# Patient Record
Sex: Male | Born: 2000 | Hispanic: Refuse to answer | Marital: Single | Smoking: Never smoker
Health system: Southern US, Community
[De-identification: ages and names within clinical notes are randomized; demographics above are authoritative.]

## PROBLEM LIST (undated history)

## (undated) HISTORY — PX: NO PAST SURGERIES: SHX2092

---
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
## 2022-01-01 LAB — COMPLETE METABOLIC PANEL WITH GFR
AG Ratio: 2 (calc) (ref 1.0–2.5)
ALT: 44 U/L (ref 8–46)
AST: 29 U/L (ref 12–32)
Albumin: 4.8 g/dL (ref 3.6–5.1)
Alkaline phosphatase (APISO): 130 U/L (ref 46–169)
BUN: 11 mg/dL (ref 7–20)
CO2: 28 mmol/L (ref 20–32)
Calcium: 9.8 mg/dL (ref 8.9–10.4)
Chloride: 103 mmol/L (ref 98–110)
Creat: 0.95 mg/dL (ref 0.60–1.24)
Globulin: 2.4 g/dL (calc) (ref 2.1–3.5)
Glucose, Bld: 76 mg/dL (ref 65–99)
Potassium: 4.2 mmol/L (ref 3.8–5.1)
Sodium: 140 mmol/L (ref 135–146)
Total Bilirubin: 0.9 mg/dL (ref 0.2–1.1)
Total Protein: 7.2 g/dL (ref 6.3–8.2)
eGFR: 118 mL/min/{1.73_m2} (ref >=60)

## 2022-01-01 LAB — CBC
HCT: 46.9 % (ref 38.5–50.0)
Hemoglobin: 16 g/dL (ref 13.2–17.1)
MCH: 30 pg (ref 27.0–33.0)
MCHC: 34.1 g/dL (ref 32.0–36.0)
MCV: 88 fL (ref 80.0–100.0)
MPV: 11.1 fL (ref 7.5–12.5)
Platelets: 230 10*3/uL (ref 140–400)
RBC: 5.33 10*6/uL (ref 4.20–5.80)
RDW: 12.2 % (ref 11.0–15.0)
WBC: 7.4 10*3/uL (ref 3.8–10.8)

## 2022-01-01 LAB — LIPID PANEL W/REFLEX DIRECT LDL
Cholesterol: 162 mg/dL (ref <170)
HDL: 51 mg/dL (ref >45)
LDL Cholesterol (Calc): 97 mg/dL (calc) (ref <110)
Non-HDL Cholesterol (Calc): 111 mg/dL (calc) (ref <120)
Total CHOL/HDL Ratio: 3.2 (calc) (ref <5.0)
Triglycerides: 54 mg/dL (ref <90)

## 2022-01-02 ENCOUNTER — Encounter: Payer: Self-pay | Admitting: Family Medicine

## 2022-01-02 DIAGNOSIS — R0683 Snoring: Secondary | ICD-10-CM

## 2022-01-02 DIAGNOSIS — R5383 Other fatigue: Secondary | ICD-10-CM

## 2022-01-03 NOTE — Progress Notes (Signed)
Your lab work is within acceptable range and there are no concerning findings.   ?

## 2022-02-09 ENCOUNTER — Ambulatory Visit: Payer: BC Managed Care – PPO | Admitting: Professional

## 2022-02-10 ENCOUNTER — Encounter: Payer: Self-pay | Admitting: Professional

## 2022-02-10 ENCOUNTER — Ambulatory Visit (INDEPENDENT_AMBULATORY_CARE_PROVIDER_SITE_OTHER): Payer: BC Managed Care – PPO | Admitting: Professional

## 2022-02-10 DIAGNOSIS — F321 Major depressive disorder, single episode, moderate: Secondary | ICD-10-CM | POA: Diagnosis not present

## 2022-02-10 DIAGNOSIS — F419 Anxiety disorder, unspecified: Secondary | ICD-10-CM | POA: Diagnosis not present

## 2022-02-10 NOTE — Progress Notes (Signed)
° ° ° ° ° ° ° ° ° ° ° ° ° ° °  Ulyana Pitones, LCMHC °
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
## 2022-02-11 ENCOUNTER — Ambulatory Visit (HOSPITAL_BASED_OUTPATIENT_CLINIC_OR_DEPARTMENT_OTHER): Payer: BC Managed Care – PPO | Attending: Family Medicine | Admitting: Internal Medicine

## 2022-02-11 DIAGNOSIS — R0683 Snoring: Secondary | ICD-10-CM | POA: Diagnosis present

## 2022-02-11 DIAGNOSIS — R5383 Other fatigue: Secondary | ICD-10-CM | POA: Insufficient documentation

## 2022-02-11 DIAGNOSIS — G4733 Obstructive sleep apnea (adult) (pediatric): Secondary | ICD-10-CM | POA: Diagnosis not present

## 2022-02-19 DIAGNOSIS — R0683 Snoring: Secondary | ICD-10-CM | POA: Diagnosis not present

## 2022-02-19 NOTE — Procedures (Signed)
    Patient Name: Jerry Cisneros, Jerry Cisneros Date: 02/12/2022 Gender: Male D.O.B: Nov 30, 2000 Age (years): 21 Referring Provider: Beatrice Lecher Height (inches): 49 Interpreting Physician: Baird Lyons MD, ABSM Weight (lbs): 224 RPSGT: Jacolyn Reedy BMI: 34 MRN: [id number] Neck Size: 16.00  CLINICAL INFORMATION Sleep Study Type: HST Indication for sleep study: Somnolence Epworth Sleepiness Score: 3  SLEEP STUDY TECHNIQUE A multi-channel overnight portable sleep study was performed. The channels recorded were: nasal airflow, thoracic respiratory movement, and oxygen saturation with a pulse oximetry. Snoring was also monitored.  MEDICATIONS Patient self administered medications include: none reported.  SLEEP ARCHITECTURE Patient was studied for 439.3 minutes. The sleep efficiency was 100.0 % and the patient was supine for 0%. The arousal index was 0.0 per hour.  RESPIRATORY PARAMETERS The overall AHI was 5.2 per hour, with a central apnea index of 0 per hour. The oxygen nadir was 91% during sleep.  CARDIAC DATA Mean heart rate during sleep was 60.0 bpm.  IMPRESSIONS - Mild obstructive sleep apnea occurred during this study (AHI = 5.2/h). - The patient had minimal or no oxygen desaturation during the study (Min O2 = 91%) - Patient snored.  DIAGNOSIS - Obstructive Sleep Apnea (G47.33)  RECOMMENDATIONS - Scores in this range are unlikely to indicate medically significant sleep disordered breathing. If results are inconsistent with clinical impression, consider in-center sleep study. If a disorder of primary somnolence is suspected, such as narcolepsy, the Sleep Center can advise protocols for NPSG, followed by Multiple Sleep Latency Test, or consultation is available. - Sleep hygiene should be reviewed to assess factors that may improve sleep quality. - Weight management and regular exercise should be initiated or continued.  [Electronically signed] 02/19/2022 01:21  PM  Baird Lyons MD, [place], American Board of Sleep Medicine NPI: [id number]                         [place], [place] [facility]  ELECTRONICALLY SIGNED ON:  02/19/2022, 1:16 PM [place] PH: [phone]   FX: [phone] [place]

## 2022-02-21 ENCOUNTER — Telehealth: Payer: Self-pay | Admitting: Family Medicine

## 2022-02-21 NOTE — Telephone Encounter (Signed)
Call patient and let him know that I do have his sleep study results back.  I would like to schedule a telephone call so that we can go over the results together.

## 2022-02-21 NOTE — Telephone Encounter (Signed)
Forwarded patient a Therapist, music. Requesting a return call to schedule a visit to discuss sleep study.

## 2022-02-21 NOTE — Telephone Encounter (Signed)
Mother also gave [place] patient contact # [phone].

## 2022-02-21 NOTE — Telephone Encounter (Signed)
Patient informed and will call back to schedule the visit as either phone or Mychart visit.

## 2022-02-23 ENCOUNTER — Encounter: Payer: Self-pay | Admitting: Professional

## 2022-02-23 ENCOUNTER — Ambulatory Visit: Payer: BC Managed Care – PPO | Admitting: Professional

## 2022-02-23 DIAGNOSIS — F321 Major depressive disorder, single episode, moderate: Secondary | ICD-10-CM

## 2022-02-23 DIAGNOSIS — F419 Anxiety disorder, unspecified: Secondary | ICD-10-CM

## 2022-02-23 NOTE — Progress Notes (Addendum)
[place] Counselor/Therapist Progress Note  Patient ID: Jerry Cisneros, MRN: [id number],    Date: 02/23/2022  Time Spent: 57 minutes 503-6pm  Treatment Type: Individual Therapy  Risk Assessment: Danger to Self:  Yes.  without intent/plan, patient admits to fleeting thought at work because he didn't want to work. He reports he has no intention of hurting himself. Pt committed to call [phone] or go to nearest ED if he cannot be safe. Self-injurious Behavior: No Danger to Others: No  Subjective: This session was held via video teletherapy The patient consented to a the video visit and was located in his home during this session. He is aware it is the responsibility of the patient to secure confidentiality on his end of the session. The provider was in a private home office for the duration of this session.    The patient arrived on time for his webex session.   Issues addressed: 1-treatment planning -pt and Clinician developed his treatment plan -pt fully participated in and agrees with his treatment plan 2-problem solving -educated -real-life example related to buying steel toed boots for work  Treatment Plan Problems Addressed  Anxiety, Eating Disorders And Obesity, Family Conflict, Unipolar Depression Goals 1. Alleviate depressive symptoms and return to previous level of effective functioning. Objective Describe current and past experiences with depression including their impact on functioning and attempts to resolve it. Target Date: 2023-02-23 Frequency: Weekly  Progress: 0 Modality: individual  Related Interventions Encourage the client to share his/her thoughts and feelings of depression; express empathy and build rapport while identifying primary cognitive, behavioral, interpersonal, or other contributors to depression. Assess current and past mood episodes including their features, frequency, severity, and duration (e.g., clinical interview supplemented by  the Inventory to Diagnose Depression). Objective Identify and replace thoughts and beliefs that support depression. Target Date: 2023-02-23 Frequency: Weekly  Progress: 0 Modality: individual  Related Interventions Conduct Cognitive-Behavioral Therapy (see Cognitive Behavior Therapy by Olevia Bowens; Overcoming Depression by Lynita Lombard al.), beginning with helping the client learn the connection among cognition, depressive feelings, and actions. Assign the client to self-monitor thoughts, feelings, and actions in daily journal (e.g., "Negative Thoughts Trigger Negative Feelings" in the Adult Psychotherapy Homework Planner by Bryn Gulling; "Daily Record of Dysfunctional Thoughts" in Cognitive Therapy of Depression by Lupita Shutter and Warren Lacy); process the journal material to challenge depressive thinking patterns and replace them with reality-based thoughts. Assign "behavioral experiments" in which depressive automatic thoughts are treated as hypotheses/prediction, reality-based alternative hypotheses/prediction are generated, and both are tested against the client's past, present, and/or future experiences. Facilitate and reinforce the client's shift from biased depressive self-talk and beliefs to reality-based cognitive messages that enhance self-confidence and increase adaptive actions (see "Positive Self-Talk" in the Adult Psychotherapy Homework Planner by Bryn Gulling). Objective Learn and implement behavioral strategies to overcome depression. Target Date: 2023-02-23 Frequency: Weekly  Progress: 0 Modality: individual  Related Interventions Engage the client in "behavioral activation," increasing his/her activity level and contact with sources of reward, while identifying processes that inhibit activation (see Behavioral Activation for Depression by Beverly Gust, Dimidjian, and Herman-Dunn; or assign "Identify and Schedule Pleasant Activities" in the Adult Psychotherapy Homework Planner by [facility]); use behavioral  techniques such as instruction, rehearsal, role-playing, role reversal, as needed, to facilitate activity in the client's daily life; reinforce success. Assist the client in developing skills that increase the likelihood of deriving pleasure from behavioral activation (e.g., assertiveness skills, developing an exercise plan, less internal/more external focus, increased social involvement); reinforce success. Objective Verbalize an  understanding and resolution of current interpersonal problems. Target Date: 2023-02-23 Frequency: Weekly  Progress: 0 Modality: individual  Related Interventions For role transitions (e.g., beginning or ending a relationship or career, moving, promotion, retirement, graduation), help the client mourn the loss of the old role while recognizing positive and negative aspects of the new role, and taking steps to gain mastery over the new role. Objective Learn and implement problem-solving and decision-making skills. Target Date: 2023-02-23 Frequency: Weekly  Progress: 0 Modality: individual  Objective Learn and implement conflict resolution skills to resolve interpersonal problems. Target Date: 2023-02-23 Frequency: Weekly  Progress: 0 Modality: individual  Related Interventions Teach conflict resolution skills (e.g., empathy, active listening, "I messages," respectful communication, assertiveness without aggression, compromise); use psychoeducation, modeling, role-playing, and rehearsal to work through several current conflicts; assign homework exercises; review and repeat so as to integrate their use into the client's life. Objective Provide behavioral, emotional, and attitudinal information toward an assessment of specifiers relevant to a DSM diagnosis, the efficacy of treatment, and the nature of the therapy relationship. Target Date: 2023-02-23 Frequency: Weekly  Progress: 0 Modality: individual  2. Appropriately grieve the loss in order to normalize mood and to  return to previously adaptive level of functioning. 3. Decrease the level of present conflict with parents while beginning to let go of or resolving past conflicts with them. Objective Describe the conflicts and the causes of conflicts between self and parents. Target Date: 2023-02-23 Frequency: Weekly  Progress: 0 Modality: individual  Related Interventions Give verbal permission for the client to have and express own feelings, thoughts, and perspectives in order to foster a sense of autonomy from family. Explore the nature of the client's family conflicts and their perceived causes. Objective Identify own as well as others' role in the family conflicts. Target Date: 2023-02-23 Frequency: Weekly  Progress: 0 Modality: individual  Related Interventions Confront the client when he/she is not taking responsibility for his/her role in the family conflict and reinforce the client for owning responsibility for his/her contribution to the conflict. Objective Increase the level of independent functioning. Target Date: 2023-02-23 Frequency: Weekly  Progress: 0 Modality: individual  Related Interventions Confront the client's emotional dependence and avoidance of economic responsibility that promotes continuing pattern of living with parents; develop a plan for the client's healthy and responsible emancipation from parents that is, if possible, complete with their blessing (e.g., finding and keeping a job, saving money, socializing with friends, finding own housing, etc.). Objective Report an increase in resolving conflicts with parents by talking calmly and assertively rather than aggressively and defensively. Target Date: 2023-02-23 Frequency: Weekly  Progress: 0 Modality: individual  Related Interventions Use role-playing, role reversal, modeling, and behavioral rehearsal to help the client develop assertive ways to resolve conflict with parents (recommend Your Perfect Right: Assertiveness and  Equality in Your Life and Relationships by Neldon Newport). 4. Develop coping strategies (e.g., feeling identification, problem-solving, assertiveness) to address emotional issues that could lead to relapse of the eating disorder. 5. Develop healthy cognitive patterns and beliefs about self that lead to positive identity and prevent a relapse of the eating disorder. 6. Develop healthy interpersonal relationships that lead to alleviation and help prevent the relapse of the eating disorder. 7. Develop healthy interpersonal relationships that lead to the alleviation and help prevent the relapse of depression. 8. Develop healthy thinking patterns and beliefs about self, others, and the world that lead to the alleviation and help prevent the relapse of depression. 9. Enhance ability to effectively  cope with the full variety of life's worries and anxieties. 10. Learn and implement coping skills that result in a reduction of anxiety and worry, and improved daily functioning. 11. Reach a level of reduced tension, increased satisfaction, and improved communication with family and/or other authority figures. 12. Recognize, accept, and cope with feelings of depression. 13. Restore normal eating patterns, healthy weight maintenance, and a realistic appraisal of body size. Objective Honestly describe the pattern of eating including types, amounts, and frequency of food consumed or hoarded. Target Date: 2023-02-23 Frequency: Weekly  Progress: 0 Modality: individual  Related Interventions Assess the historical course of the disorder including the amount, type, and pattern of the client's food intake (e.g., too little food, too much food, binge eating, or hoarding food); perceived personal and interpersonal triggers and personal goals. Objective Establish regular eating patterns by eating at regular intervals and consuming optimal daily calories. Target Date: 2023-02-23 Frequency: Weekly  Progress: 0  Modality: individual  Related Interventions Establish healthy weight goals for the client per the Body Mass Index (BMI), the [place] Height and Weight Tables, or some other recognized standard. Objective Learn and implement skills for managing urges to engage in unhealthy eating or weight loss practices. Target Date: 2023-02-23 Frequency: Weekly  Progress: 0 Modality: individual  Related Interventions Teach the client tailored skills to manage high-risk situations including distraction, positive self-talk, problem-solving, conflict resolution (e.g., empathy, active listening, "I messages," respectful communication, assertiveness without aggression, compromise), or other social/ communication skills; use modeling, role-playing, and behavior rehearsal to work through several current situations. Objective State a basis for positive identity that is not based on weight and appearance but on character, traits, relationships, and intrinsic value. Target Date: 2023-02-23 Frequency: Weekly  Progress: 0 Modality: individual  Related Interventions Assist the client in identifying a basis for self-worth apart from body image by reviewing his/her talents, successes, positive traits, importance to others, and intrinsic spiritual value. Objective Verbalize an understanding of relapse prevention and the distinction between a lapse and a relapse. Target Date: 2023-02-23 Frequency: Weekly  Progress: 0 Modality: individual  Related Interventions Discuss with the client the distinction between a lapse and relapse, associating a lapse with an initial and reversible return of distress, urges, or to avoid, and relapse with the decision to return to the cycle of maladaptive thoughts and actions (e.g., feeling anxious, binging, then purging). Identify with the client future situations or circumstances in which lapses could occur. 14. Stabilize anxiety level while increasing ability to function on a daily  basis. Objective Learn and implement problem-solving strategies for realistically addressing worries. Target Date: 2023-02-23 Frequency: Weekly  Progress: 0 Modality: individual  Related Interventions Teach the client problem-solving strategies involving specifically defining a problem, generating options for addressing it, evaluating the pros and cons of each option, selecting and implementing an optional action, and reevaluating and refining the action (or assign "Applying Problem-Solving to Interpersonal Conflict" in the Adult Psychotherapy Homework Planner by Bryn Gulling). Objective Identify, challenge, and replace biased, fearful self-talk with positive, realistic, and empowering self-talk. Target Date: 2023-02-23 Frequency: Weekly  Progress: 0 Modality: individual  Related Interventions Explore the client's schema and self-talk that mediate his/her fear response; assist him/her in challenging the biases; replace the distorted messages with reality-based alternatives and positive, realistic self-talk that will increase his/her self-confidence in coping with irrational fears (see Cognitive Therapy of Anxiety Disorders by Alison Stalling). Assign the client a homework exercise in which he/she identifies fearful self-talk, identifies biases in the self-talk, generates alternatives,  and tests through behavioral experiments (or assign "Negative Thoughts Trigger Negative Feelings" in the Adult Psychotherapy Homework Planner by [facility]); review and reinforce success, providing corrective feedback toward improvement. Objective Learn to accept limitations in life and commit to tolerating, rather than avoiding, unpleasant emotions while accomplishing meaningful goals. Target Date: 2023-02-23 Frequency: Weekly  Progress: 0 Modality: individual  Related Interventions Use techniques from Acceptance and Commitment Therapy to help client accept uncomfortable realities such as lack of complete control,  imperfections, and uncertainty and tolerate unpleasant emotions and thoughts in order to accomplish value-consistent goals. Objective Learn and implement personal and interpersonal skills to reduce anxiety and improve interpersonal relationships. Target Date: 2023-02-23 Frequency: Weekly  Progress: 0 Modality: individual  Related Interventions Use instruction, modeling, and role-playing to build the client's general social, communication, and/or conflict resolution skills. Objective Maintain involvement in work, family, and social activities. Target Date: 2023-02-23 Frequency: Weekly  Progress: 0 Modality: individual  Related Interventions Support the client in following through with work, family, and social activities rather than escaping or avoiding them to focus on anxiety. 15. Terminate overeating and implement lifestyle changes that lead to weight loss and improved health. 16. Terminate the pattern of binge eating and purging behavior with a return to eating normal amounts of nutritious foods.  Diagnosis:MDD (major depressive disorder), single episode, moderate ([place])  Anxiety  Plan:  -begin noticing negative thinking and try to consider more positive alternatives. -meet again on Wednesday, March 09, 2022 at 5pm.

## 2022-02-23 NOTE — Progress Notes (Unsigned)
° ° ° ° ° ° ° ° ° ° ° ° ° ° °  Erick Murin, LCMHC °

## 2022-03-09 ENCOUNTER — Ambulatory Visit: Payer: BC Managed Care – PPO | Admitting: Professional

## 2022-03-17 ENCOUNTER — Encounter: Payer: Self-pay | Admitting: Professional

## 2022-03-17 ENCOUNTER — Ambulatory Visit: Payer: BC Managed Care – PPO | Admitting: Professional

## 2022-03-17 DIAGNOSIS — F321 Major depressive disorder, single episode, moderate: Secondary | ICD-10-CM

## 2022-03-17 DIAGNOSIS — F419 Anxiety disorder, unspecified: Secondary | ICD-10-CM | POA: Diagnosis not present

## 2022-03-17 NOTE — Progress Notes (Signed)
[facility] Counselor/Therapist Progress Note  Patient ID: Jerry Cisneros, MRN: [id number],    Date: 02/23/2022  Time Spent: 62 minutes [id number]  Treatment Type: Individual Therapy  Risk Assessment: Danger to Self:  Yes.  without intent/plan, patient admits to fleeting thought at work because he didn't want to work. He reports he has no intention of hurting himself. Pt committed to call [phone] or go to nearest ED if he cannot be safe. Self-injurious Behavior: No Danger to Others: No  Subjective: This session was held via video teletherapy The patient consented to a the video visit and was located in his home during this session. He is aware it is the responsibility of the patient to secure confidentiality on his end of the session. The provider was in a private home office for the duration of this session.    The patient arrived on time for his webex session.   Issues addressed: 1-personal a-pt misstepped with friends online and has been down for the past several days -he sent an unsavory joke online to his friends -people found it offensive and one girl cut him off -he has had this issue with getting into interpersonal struggles since high school -he has intentionally ended relationships because he knew they were already fading away b-mistakes occur and learn from vs. villanize yourself -how to forgive yourself for missteps -how to take mistakes as learning opportunities c-fleeting thoughts of suicide as result -pt denies plan but admits he was struggling due to his behavior online -he is not currently suicidal and has no intention to hurt himself or anyone else -Clinician reiterated plan to call [phone] or go to nearest ED if he cannot remain safe 2-professional -pt does his work and gets there on-time  Treatment Plan Problems Addressed  Anxiety, Eating Disorders And Obesity, Family Conflict, Unipolar Depression Goals 1. Alleviate depressive symptoms and return  to previous level of effective functioning. Objective Describe current and past experiences with depression including their impact on functioning and attempts to resolve it. Target Date: 2023-02-23 Frequency: Weekly  Progress: 0 Modality: individual  Related Interventions Encourage the client to share his/her thoughts and feelings of depression; express empathy and build rapport while identifying primary cognitive, behavioral, interpersonal, or other contributors to depression. Assess current and past mood episodes including their features, frequency, severity, and duration (e.g., clinical interview supplemented by the Inventory to Diagnose Depression). Objective Identify and replace thoughts and beliefs that support depression. Target Date: 2023-02-23 Frequency: Weekly  Progress: 0 Modality: individual  Related Interventions Conduct Cognitive-Behavioral Therapy (see Cognitive Behavior Therapy by Olevia Bowens; Overcoming Depression by Lynita Lombard al.), beginning with helping the client learn the connection among cognition, depressive feelings, and actions. Assign the client to self-monitor thoughts, feelings, and actions in daily journal (e.g., "Negative Thoughts Trigger Negative Feelings" in the Adult Psychotherapy Homework Planner by Bryn Gulling; "Daily Record of Dysfunctional Thoughts" in Cognitive Therapy of Depression by Lupita Shutter and Warren Lacy); process the journal material to challenge depressive thinking patterns and replace them with reality-based thoughts. Assign "behavioral experiments" in which depressive automatic thoughts are treated as hypotheses/prediction, reality-based alternative hypotheses/prediction are generated, and both are tested against the client's past, present, and/or future experiences. Facilitate and reinforce the client's shift from biased depressive self-talk and beliefs to reality-based cognitive messages that enhance self-confidence and increase adaptive actions (see  "Positive Self-Talk" in the Adult Psychotherapy Homework Planner by Bryn Gulling). Objective Learn and implement behavioral strategies to overcome depression. Target Date: 2023-02-23 Frequency: Weekly  Progress: 0 Modality:  individual  Related Interventions Engage the client in "behavioral activation," increasing his/her activity level and contact with sources of reward, while identifying processes that inhibit activation (see Behavioral Activation for Depression by Beverly Gust, Dimidjian, and Herman-Dunn; or assign "Identify and Schedule Pleasant Activities" in the Adult Psychotherapy Homework Planner by [facility]); use behavioral techniques such as instruction, rehearsal, role-playing, role reversal, as needed, to facilitate activity in the client's daily life; reinforce success. Assist the client in developing skills that increase the likelihood of deriving pleasure from behavioral activation (e.g., assertiveness skills, developing an exercise plan, less internal/more external focus, increased social involvement); reinforce success. Objective Verbalize an understanding and resolution of current interpersonal problems. Target Date: 2023-02-23 Frequency: Weekly  Progress: 0 Modality: individual  Related Interventions For role transitions (e.g., beginning or ending a relationship or career, moving, promotion, retirement, graduation), help the client mourn the loss of the old role while recognizing positive and negative aspects of the new role, and taking steps to gain mastery over the new role. Objective Learn and implement problem-solving and decision-making skills. Target Date: 2023-02-23 Frequency: Weekly  Progress: 0 Modality: individual  Objective Learn and implement conflict resolution skills to resolve interpersonal problems. Target Date: 2023-02-23 Frequency: Weekly  Progress: 0 Modality: individual  Related Interventions Teach conflict resolution skills (e.g., empathy, active listening, "I  messages," respectful communication, assertiveness without aggression, compromise); use psychoeducation, modeling, role-playing, and rehearsal to work through several current conflicts; assign homework exercises; review and repeat so as to integrate their use into the client's life. Objective Provide behavioral, emotional, and attitudinal information toward an assessment of specifiers relevant to a DSM diagnosis, the efficacy of treatment, and the nature of the therapy relationship. Target Date: 2023-02-23 Frequency: Weekly  Progress: 0 Modality: individual  2. Appropriately grieve the loss in order to normalize mood and to return to previously adaptive level of functioning. 3. Decrease the level of present conflict with parents while beginning to let go of or resolving past conflicts with them. Objective Describe the conflicts and the causes of conflicts between self and parents. Target Date: 2023-02-23 Frequency: Weekly  Progress: 0 Modality: individual  Related Interventions Give verbal permission for the client to have and express own feelings, thoughts, and perspectives in order to foster a sense of autonomy from family. Explore the nature of the client's family conflicts and their perceived causes. Objective Identify own as well as others' role in the family conflicts. Target Date: 2023-02-23 Frequency: Weekly  Progress: 0 Modality: individual  Related Interventions Confront the client when he/she is not taking responsibility for his/her role in the family conflict and reinforce the client for owning responsibility for his/her contribution to the conflict. Objective Increase the level of independent functioning. Target Date: 2023-02-23 Frequency: Weekly  Progress: 0 Modality: individual  Related Interventions Confront the client's emotional dependence and avoidance of economic responsibility that promotes continuing pattern of living with parents; develop a plan for the client's  healthy and responsible emancipation from parents that is, if possible, complete with their blessing (e.g., finding and keeping a job, saving money, socializing with friends, finding own housing, etc.). Objective Report an increase in resolving conflicts with parents by talking calmly and assertively rather than aggressively and defensively. Target Date: 2023-02-23 Frequency: Weekly  Progress: 0 Modality: individual  Related Interventions Use role-playing, role reversal, modeling, and behavioral rehearsal to help the client develop assertive ways to resolve conflict with parents (recommend Your Perfect Right: Assertiveness and Equality in Your Life and Relationships by Neldon Newport). 4.  Develop coping strategies (e.g., feeling identification, problem-solving, assertiveness) to address emotional issues that could lead to relapse of the eating disorder. 5. Develop healthy cognitive patterns and beliefs about self that lead to positive identity and prevent a relapse of the eating disorder. 6. Develop healthy interpersonal relationships that lead to alleviation and help prevent the relapse of the eating disorder. 7. Develop healthy interpersonal relationships that lead to the alleviation and help prevent the relapse of depression. 8. Develop healthy thinking patterns and beliefs about self, others, and the world that lead to the alleviation and help prevent the relapse of depression. 9. Enhance ability to effectively cope with the full variety of life's worries and anxieties. 10. Learn and implement coping skills that result in a reduction of anxiety and worry, and improved daily functioning. 11. Reach a level of reduced tension, increased satisfaction, and improved communication with family and/or other authority figures. 12. Recognize, accept, and cope with feelings of depression. 13. Restore normal eating patterns, healthy weight maintenance, and a realistic appraisal of body  size. Objective Honestly describe the pattern of eating including types, amounts, and frequency of food consumed or hoarded. Target Date: 2023-02-23 Frequency: Weekly  Progress: 0 Modality: individual  Related Interventions Assess the historical course of the disorder including the amount, type, and pattern of the client's food intake (e.g., too little food, too much food, binge eating, or hoarding food); perceived personal and interpersonal triggers and personal goals. Objective Establish regular eating patterns by eating at regular intervals and consuming optimal daily calories. Target Date: 2023-02-23 Frequency: Weekly  Progress: 0 Modality: individual  Related Interventions Establish healthy weight goals for the client per the Body Mass Index (BMI), the [place] Height and Weight Tables, or some other recognized standard. Objective Learn and implement skills for managing urges to engage in unhealthy eating or weight loss practices. Target Date: 2023-02-23 Frequency: Weekly  Progress: 0 Modality: individual  Related Interventions Teach the client tailored skills to manage high-risk situations including distraction, positive self-talk, problem-solving, conflict resolution (e.g., empathy, active listening, "I messages," respectful communication, assertiveness without aggression, compromise), or other social/ communication skills; use modeling, role-playing, and behavior rehearsal to work through several current situations. Objective State a basis for positive identity that is not based on weight and appearance but on character, traits, relationships, and intrinsic value. Target Date: 2023-02-23 Frequency: Weekly  Progress: 0 Modality: individual  Related Interventions Assist the client in identifying a basis for self-worth apart from body image by reviewing his/her talents, successes, positive traits, importance to others, and intrinsic spiritual value. Objective Verbalize an  understanding of relapse prevention and the distinction between a lapse and a relapse. Target Date: 2023-02-23 Frequency: Weekly  Progress: 0 Modality: individual  Related Interventions Discuss with the client the distinction between a lapse and relapse, associating a lapse with an initial and reversible return of distress, urges, or to avoid, and relapse with the decision to return to the cycle of maladaptive thoughts and actions (e.g., feeling anxious, binging, then purging). Identify with the client future situations or circumstances in which lapses could occur. 14. Stabilize anxiety level while increasing ability to function on a daily basis. Objective Learn and implement problem-solving strategies for realistically addressing worries. Target Date: 2023-02-23 Frequency: Weekly  Progress: 0 Modality: individual  Related Interventions Teach the client problem-solving strategies involving specifically defining a problem, generating options for addressing it, evaluating the pros and cons of each option, selecting and implementing an optional action, and reevaluating and refining the action (or  assign "Applying Problem-Solving to Interpersonal Conflict" in the Adult Psychotherapy Homework Planner by Bryn Gulling). Objective Identify, challenge, and replace biased, fearful self-talk with positive, realistic, and empowering self-talk. Target Date: 2023-02-23 Frequency: Weekly  Progress: 0 Modality: individual  Related Interventions Explore the client's schema and self-talk that mediate his/her fear response; assist him/her in challenging the biases; replace the distorted messages with reality-based alternatives and positive, realistic self-talk that will increase his/her self-confidence in coping with irrational fears (see Cognitive Therapy of Anxiety Disorders by Alison Stalling). Assign the client a homework exercise in which he/she identifies fearful self-talk, identifies biases in the self-talk,  generates alternatives, and tests through behavioral experiments (or assign "Negative Thoughts Trigger Negative Feelings" in the Adult Psychotherapy Homework Planner by [facility]); review and reinforce success, providing corrective feedback toward improvement. Objective Learn to accept limitations in life and commit to tolerating, rather than avoiding, unpleasant emotions while accomplishing meaningful goals. Target Date: 2023-02-23 Frequency: Weekly  Progress: 0 Modality: individual  Related Interventions Use techniques from Acceptance and Commitment Therapy to help client accept uncomfortable realities such as lack of complete control, imperfections, and uncertainty and tolerate unpleasant emotions and thoughts in order to accomplish value-consistent goals. Objective Learn and implement personal and interpersonal skills to reduce anxiety and improve interpersonal relationships. Target Date: 2023-02-23 Frequency: Weekly  Progress: 0 Modality: individual  Related Interventions Use instruction, modeling, and role-playing to build the client's general social, communication, and/or conflict resolution skills. Objective Maintain involvement in work, family, and social activities. Target Date: 2023-02-23 Frequency: Weekly  Progress: 0 Modality: individual  Related Interventions Support the client in following through with work, family, and social activities rather than escaping or avoiding them to focus on anxiety. 15. Terminate overeating and implement lifestyle changes that lead to weight loss and improved health. 16. Terminate the pattern of binge eating and purging behavior with a return to eating normal amounts of nutritious foods.  Diagnosis:MDD (major depressive disorder), single episode, moderate ([place])  Anxiety  Plan:  -begin tracking negative thinking, reading grounding activities and begin using to assist with anxiety -meet again on Thursday, March 31, 2022 at 5pm in-person.

## 2022-03-21 ENCOUNTER — Ambulatory Visit: Payer: BC Managed Care – PPO | Admitting: Professional

## 2022-03-31 ENCOUNTER — Encounter: Payer: Self-pay | Admitting: Professional

## 2022-03-31 ENCOUNTER — Ambulatory Visit: Payer: BC Managed Care – PPO | Admitting: Professional

## 2022-03-31 DIAGNOSIS — F321 Major depressive disorder, single episode, moderate: Secondary | ICD-10-CM

## 2022-03-31 DIAGNOSIS — F419 Anxiety disorder, unspecified: Secondary | ICD-10-CM

## 2022-03-31 NOTE — Progress Notes (Signed)
[facility] Counselor/Therapist Progress Note  Patient ID: Jerry Cisneros, MRN: [id number],    Date: 02/23/2022  Time Spent: 68 minutes [id number]  Treatment Type: Individual Therapy  Risk Assessment: Danger to Self:  Yes.  without intent/plan, patient admits to fleeting thought at work because he didn't want to work. He reports he has no intention of hurting himself. Pt committed to call [phone] or go to nearest ED if he cannot be safe. Self-injurious Behavior: No Danger to Others: No  Subjective: This session was held via video teletherapy The patient consented to a the video visit and was located in his home during this session. He is aware it is the responsibility of the patient to secure confidentiality on his end of the session. The provider was in a private home office for the duration of this session.    The patient arrived on time for his webex session.   Issues addressed: 1-homework -begin tracking negative thinking, reading grounding activities and begin using to assist with anxiety -he did not really try -discussed how 336 between session and if doesn't do any work between session nothing will change  2-professional a-moving to a different location with new hours and perhaps new boss b-stressed about coworkers and boss a bit -current boss pretty chill -company not doing well and people being pulled ot a different location -worrying because he is not meeting expectations of having trailers ready   -it is taken to different location and hey will get called out for a trailer that is not fully loaded 3-personal a-wants to get back in school -started a course online and it is currently review b-he has talked to another person  who explained his need to work specific projects c-patient has moved past social mistakes discussed at previous session 4-anxiety -started attending Anxiety Support group weekly and finds it helpful  Treatment Plan Problems Addressed   Anxiety, Eating Disorders And Obesity, Family Conflict, Unipolar Depression Goals 1. Alleviate depressive symptoms and return to previous level of effective functioning. Objective Describe current and past experiences with depression including their impact on functioning and attempts to resolve it. Target Date: 2023-02-23 Frequency: Weekly  Progress: 0 Modality: individual  Related Interventions Encourage the client to share his/her thoughts and feelings of depression; express empathy and build rapport while identifying primary cognitive, behavioral, interpersonal, or other contributors to depression. Assess current and past mood episodes including their features, frequency, severity, and duration (e.g., clinical interview supplemented by the Inventory to Diagnose Depression). Objective Identify and replace thoughts and beliefs that support depression. Target Date: 2023-02-23 Frequency: Weekly  Progress: 0 Modality: individual  Related Interventions Conduct Cognitive-Behavioral Therapy (see Cognitive Behavior Therapy by Olevia Bowens; Overcoming Depression by Lynita Lombard al.), beginning with helping the client learn the connection among cognition, depressive feelings, and actions. Assign the client to self-monitor thoughts, feelings, and actions in daily journal (e.g., "Negative Thoughts Trigger Negative Feelings" in the Adult Psychotherapy Homework Planner by Bryn Gulling; "Daily Record of Dysfunctional Thoughts" in Cognitive Therapy of Depression by Lupita Shutter and Warren Lacy); process the journal material to challenge depressive thinking patterns and replace them with reality-based thoughts. Assign "behavioral experiments" in which depressive automatic thoughts are treated as hypotheses/prediction, reality-based alternative hypotheses/prediction are generated, and both are tested against the client's past, present, and/or future experiences. Facilitate and reinforce the client's shift from biased depressive  self-talk and beliefs to reality-based cognitive messages that enhance self-confidence and increase adaptive actions (see "Positive Self-Talk" in the Adult Psychotherapy Homework Planner by Bryn Gulling).  Objective Learn and implement behavioral strategies to overcome depression. Target Date: 2023-02-23 Frequency: Weekly  Progress: 0 Modality: individual  Related Interventions Engage the client in "behavioral activation," increasing his/her activity level and contact with sources of reward, while identifying processes that inhibit activation (see Behavioral Activation for Depression by Beverly Gust, Dimidjian, and Herman-Dunn; or assign "Identify and Schedule Pleasant Activities" in the Adult Psychotherapy Homework Planner by [facility]); use behavioral techniques such as instruction, rehearsal, role-playing, role reversal, as needed, to facilitate activity in the client's daily life; reinforce success. Assist the client in developing skills that increase the likelihood of deriving pleasure from behavioral activation (e.g., assertiveness skills, developing an exercise plan, less internal/more external focus, increased social involvement); reinforce success. Objective Verbalize an understanding and resolution of current interpersonal problems. Target Date: 2023-02-23 Frequency: Weekly  Progress: 0 Modality: individual  Related Interventions For role transitions (e.g., beginning or ending a relationship or career, moving, promotion, retirement, graduation), help the client mourn the loss of the old role while recognizing positive and negative aspects of the new role, and taking steps to gain mastery over the new role. Objective Learn and implement problem-solving and decision-making skills. Target Date: 2023-02-23 Frequency: Weekly  Progress: 0 Modality: individual  Objective Learn and implement conflict resolution skills to resolve interpersonal problems. Target Date: 2023-02-23 Frequency: Weekly  Progress: 0  Modality: individual  Related Interventions Teach conflict resolution skills (e.g., empathy, active listening, "I messages," respectful communication, assertiveness without aggression, compromise); use psychoeducation, modeling, role-playing, and rehearsal to work through several current conflicts; assign homework exercises; review and repeat so as to integrate their use into the client's life. Objective Provide behavioral, emotional, and attitudinal information toward an assessment of specifiers relevant to a DSM diagnosis, the efficacy of treatment, and the nature of the therapy relationship. Target Date: 2023-02-23 Frequency: Weekly  Progress: 0 Modality: individual  2. Appropriately grieve the loss in order to normalize mood and to return to previously adaptive level of functioning. 3. Decrease the level of present conflict with parents while beginning to let go of or resolving past conflicts with them. Objective Describe the conflicts and the causes of conflicts between self and parents. Target Date: 2023-02-23 Frequency: Weekly  Progress: 0 Modality: individual  Related Interventions Give verbal permission for the client to have and express own feelings, thoughts, and perspectives in order to foster a sense of autonomy from family. Explore the nature of the client's family conflicts and their perceived causes. Objective Identify own as well as others' role in the family conflicts. Target Date: 2023-02-23 Frequency: Weekly  Progress: 0 Modality: individual  Related Interventions Confront the client when he/she is not taking responsibility for his/her role in the family conflict and reinforce the client for owning responsibility for his/her contribution to the conflict. Objective Increase the level of independent functioning. Target Date: 2023-02-23 Frequency: Weekly  Progress: 0 Modality: individual  Related Interventions Confront the client's emotional dependence and avoidance of  economic responsibility that promotes continuing pattern of living with parents; develop a plan for the client's healthy and responsible emancipation from parents that is, if possible, complete with their blessing (e.g., finding and keeping a job, saving money, socializing with friends, finding own housing, etc.). Objective Report an increase in resolving conflicts with parents by talking calmly and assertively rather than aggressively and defensively. Target Date: 2023-02-23 Frequency: Weekly  Progress: 0 Modality: individual  Related Interventions Use role-playing, role reversal, modeling, and behavioral rehearsal to help the client develop assertive ways to resolve conflict with  parents (recommend Your Perfect Right: Assertiveness and Equality in Your Life and Relationships by Neldon Newport). 4. Develop coping strategies (e.g., feeling identification, problem-solving, assertiveness) to address emotional issues that could lead to relapse of the eating disorder. 5. Develop healthy cognitive patterns and beliefs about self that lead to positive identity and prevent a relapse of the eating disorder. 6. Develop healthy interpersonal relationships that lead to alleviation and help prevent the relapse of the eating disorder. 7. Develop healthy interpersonal relationships that lead to the alleviation and help prevent the relapse of depression. 8. Develop healthy thinking patterns and beliefs about self, others, and the world that lead to the alleviation and help prevent the relapse of depression. 9. Enhance ability to effectively cope with the full variety of life's worries and anxieties. 10. Learn and implement coping skills that result in a reduction of anxiety and worry, and improved daily functioning. 11. Reach a level of reduced tension, increased satisfaction, and improved communication with family and/or other authority figures. 12. Recognize, accept, and cope with feelings of depression. 13.  Restore normal eating patterns, healthy weight maintenance, and a realistic appraisal of body size. Objective Honestly describe the pattern of eating including types, amounts, and frequency of food consumed or hoarded. Target Date: 2023-02-23 Frequency: Weekly  Progress: 0 Modality: individual  Related Interventions Assess the historical course of the disorder including the amount, type, and pattern of the client's food intake (e.g., too little food, too much food, binge eating, or hoarding food); perceived personal and interpersonal triggers and personal goals. Objective Establish regular eating patterns by eating at regular intervals and consuming optimal daily calories. Target Date: 2023-02-23 Frequency: Weekly  Progress: 0 Modality: individual  Related Interventions Establish healthy weight goals for the client per the Body Mass Index (BMI), the [place] Height and Weight Tables, or some other recognized standard. Objective Learn and implement skills for managing urges to engage in unhealthy eating or weight loss practices. Target Date: 2023-02-23 Frequency: Weekly  Progress: 0 Modality: individual  Related Interventions Teach the client tailored skills to manage high-risk situations including distraction, positive self-talk, problem-solving, conflict resolution (e.g., empathy, active listening, "I messages," respectful communication, assertiveness without aggression, compromise), or other social/ communication skills; use modeling, role-playing, and behavior rehearsal to work through several current situations. Objective State a basis for positive identity that is not based on weight and appearance but on character, traits, relationships, and intrinsic value. Target Date: 2023-02-23 Frequency: Weekly  Progress: 0 Modality: individual  Related Interventions Assist the client in identifying a basis for self-worth apart from body image by reviewing his/her talents, successes, positive  traits, importance to others, and intrinsic spiritual value. Objective Verbalize an understanding of relapse prevention and the distinction between a lapse and a relapse. Target Date: 2023-02-23 Frequency: Weekly  Progress: 0 Modality: individual  Related Interventions Discuss with the client the distinction between a lapse and relapse, associating a lapse with an initial and reversible return of distress, urges, or to avoid, and relapse with the decision to return to the cycle of maladaptive thoughts and actions (e.g., feeling anxious, binging, then purging). Identify with the client future situations or circumstances in which lapses could occur. 14. Stabilize anxiety level while increasing ability to function on a daily basis. Objective Learn and implement problem-solving strategies for realistically addressing worries. Target Date: 2023-02-23 Frequency: Weekly  Progress: 0 Modality: individual  Related Interventions Teach the client problem-solving strategies involving specifically defining a problem, generating options for addressing it, evaluating the pros  and cons of each option, selecting and implementing an optional action, and reevaluating and refining the action (or assign "Applying Problem-Solving to Interpersonal Conflict" in the Adult Psychotherapy Homework Planner by Bryn Gulling). Objective Identify, challenge, and replace biased, fearful self-talk with positive, realistic, and empowering self-talk. Target Date: 2023-02-23 Frequency: Weekly  Progress: 0 Modality: individual  Related Interventions Explore the client's schema and self-talk that mediate his/her fear response; assist him/her in challenging the biases; replace the distorted messages with reality-based alternatives and positive, realistic self-talk that will increase his/her self-confidence in coping with irrational fears (see Cognitive Therapy of Anxiety Disorders by Alison Stalling). Assign the client a homework exercise in  which he/she identifies fearful self-talk, identifies biases in the self-talk, generates alternatives, and tests through behavioral experiments (or assign "Negative Thoughts Trigger Negative Feelings" in the Adult Psychotherapy Homework Planner by [facility]); review and reinforce success, providing corrective feedback toward improvement. Objective Learn to accept limitations in life and commit to tolerating, rather than avoiding, unpleasant emotions while accomplishing meaningful goals. Target Date: 2023-02-23 Frequency: Weekly  Progress: 0 Modality: individual  Related Interventions Use techniques from Acceptance and Commitment Therapy to help client accept uncomfortable realities such as lack of complete control, imperfections, and uncertainty and tolerate unpleasant emotions and thoughts in order to accomplish value-consistent goals. Objective Learn and implement personal and interpersonal skills to reduce anxiety and improve interpersonal relationships. Target Date: 2023-02-23 Frequency: Weekly  Progress: 0 Modality: individual  Related Interventions Use instruction, modeling, and role-playing to build the client's general social, communication, and/or conflict resolution skills. Objective Maintain involvement in work, family, and social activities. Target Date: 2023-02-23 Frequency: Weekly  Progress: 0 Modality: individual  Related Interventions Support the client in following through with work, family, and social activities rather than escaping or avoiding them to focus on anxiety. 15. Terminate overeating and implement lifestyle changes that lead to weight loss and improved health. 16. Terminate the pattern of binge eating and purging behavior with a return to eating normal amounts of nutritious foods.  Diagnosis:MDD (major depressive disorder), single episode, moderate ([place])  Anxiety  Plan:  -pt has Clinician phone number if needing any urgent support between sessions. -pt reminded  of how to access [phone] or go to nearest ED if feeling suicidal or homocidal and unable to remain safe. -pt reports he will not do anything to hurt himself or anyone else and will call for assistance as needed. -meet again on Thursday, May 05, 2022 at 5pm.

## 2022-04-07 ENCOUNTER — Ambulatory Visit: Payer: BC Managed Care – PPO | Admitting: Professional

## 2022-04-07 ENCOUNTER — Encounter: Payer: Self-pay | Admitting: Professional

## 2022-04-07 DIAGNOSIS — F419 Anxiety disorder, unspecified: Secondary | ICD-10-CM | POA: Diagnosis not present

## 2022-04-07 DIAGNOSIS — F321 Major depressive disorder, single episode, moderate: Secondary | ICD-10-CM

## 2022-04-07 NOTE — Progress Notes (Signed)
[facility] Counselor/Therapist Progress Note  Patient ID: Jerry Cisneros, MRN: [id number],    Date: 02/23/2022  Time Spent: 57 minutes [id number]  Treatment Type: Individual Therapy  Risk Assessment: Danger to Self:  Yes.  without intent/plan, patient admits to fleeting thought at work because he didn't want to work. He reports he has no intention of hurting himself. Pt committed to call [phone] or go to nearest ED if he cannot be safe. Self-injurious Behavior: No Danger to Others: No  Subjective: This session was held via video teletherapy The patient consented to a the video visit and was located in his home during this session. He is aware it is the responsibility of the patient to secure confidentiality on his end of the session. The provider was in a private home office for the duration of this session.    The patient arrived early for his webex session.   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 c-pt admits that he does not feel confident -able to identify that his father is not confident in himself   -his father was put down by his parents and pt's mother also puts him down d-feels his age is a barrier at work   -has grown a beard to appear older -discussed age vs competence and confidence -pt had shared his father's need to seek assistance from pt on work tasks   -if age measured competence his father would not require his help -is this his perspective or something said by someone to/overheard by him   -pt admits it is what he is thinking 3-personal a-wants to get back in school -has been putting off his school work though this is what he really wants -he values education and wants to attend school b-how to set  himself up for success -create schedule -give himself time to relax once work is completed -consider making a step by step plan to help him achieve his goals 4-pt talked with Clinician about discomfort discussing religious issues -Clinician thanked patient for discussing, accepted his feedback, and apologized if he felt uncomfortable   -pt is the person that presented his concerns related to his mother pushing her religion on him   -pt has expectations imposed on him at home and he is uncertain if he believes anything -Clinician reminded pt that moving forward his spirituality will only be broached by him -he was appropriate and confronted his concerns well  Treatment Plan Problems Addressed  Anxiety, Eating Disorders And Obesity, Family Conflict, Unipolar Depression Goals 1. Alleviate depressive symptoms and return to previous level of effective functioning. Objective Describe current and past experiences with depression including their impact on functioning and attempts to resolve it. Target Date: 2023-02-23 Frequency: Weekly  Progress: 0 Modality: individual  Related Interventions Encourage the client to share his/her thoughts and feelings of depression; express empathy and build rapport while identifying primary cognitive, behavioral, interpersonal, or other contributors to depression. Assess current and past mood episodes including their features, frequency, severity, and duration (e.g., clinical interview supplemented by the Inventory to Diagnose Depression). Objective Identify and replace thoughts and beliefs that support depression. Target Date: 2023-02-23 Frequency: Weekly  Progress: 0 Modality: individual  Related Interventions Conduct Cognitive-Behavioral Therapy (see Cognitive Behavior Therapy by Olevia Bowens;  Overcoming Depression by Lynita Lombard al.), beginning with helping the client learn the connection among cognition, depressive feelings, and actions. Assign the client to  self-monitor thoughts, feelings, and actions in daily journal (e.g., "Negative Thoughts Trigger Negative Feelings" in the Adult Psychotherapy Homework Planner by Bryn Gulling; "Daily Record of Dysfunctional Thoughts" in Cognitive Therapy of Depression by Lupita Shutter and Warren Lacy); process the journal material to challenge depressive thinking patterns and replace them with reality-based thoughts. Assign "behavioral experiments" in which depressive automatic thoughts are treated as hypotheses/prediction, reality-based alternative hypotheses/prediction are generated, and both are tested against the client's past, present, and/or future experiences. Facilitate and reinforce the client's shift from biased depressive self-talk and beliefs to reality-based cognitive messages that enhance self-confidence and increase adaptive actions (see "Positive Self-Talk" in the Adult Psychotherapy Homework Planner by Bryn Gulling). Objective Learn and implement behavioral strategies to overcome depression. Target Date: 2023-02-23 Frequency: Weekly  Progress: 0 Modality: individual  Related Interventions Engage the client in "behavioral activation," increasing his/her activity level and contact with sources of reward, while identifying processes that inhibit activation (see Behavioral Activation for Depression by Beverly Gust, Dimidjian, and Herman-Dunn; or assign "Identify and Schedule Pleasant Activities" in the Adult Psychotherapy Homework Planner by [facility]); use behavioral techniques such as instruction, rehearsal, role-playing, role reversal, as needed, to facilitate activity in the client's daily life; reinforce success. Assist the client in developing skills that increase the likelihood of deriving pleasure from behavioral activation (e.g., assertiveness skills, developing an exercise plan, less internal/more external focus, increased social involvement); reinforce success. Objective Verbalize an understanding and resolution of  current interpersonal problems. Target Date: 2023-02-23 Frequency: Weekly  Progress: 0 Modality: individual  Related Interventions For role transitions (e.g., beginning or ending a relationship or career, moving, promotion, retirement, graduation), help the client mourn the loss of the old role while recognizing positive and negative aspects of the new role, and taking steps to gain mastery over the new role. Objective Learn and implement problem-solving and decision-making skills. Target Date: 2023-02-23 Frequency: Weekly  Progress: 0 Modality: individual  Objective Learn and implement conflict resolution skills to resolve interpersonal problems. Target Date: 2023-02-23 Frequency: Weekly  Progress: 0 Modality: individual  Related Interventions Teach conflict resolution skills (e.g., empathy, active listening, "I messages," respectful communication, assertiveness without aggression, compromise); use psychoeducation, modeling, role-playing, and rehearsal to work through several current conflicts; assign homework exercises; review and repeat so as to integrate their use into the client's life. Objective Provide behavioral, emotional, and attitudinal information toward an assessment of specifiers relevant to a DSM diagnosis, the efficacy of treatment, and the nature of the therapy relationship. Target Date: 2023-02-23 Frequency: Weekly  Progress: 0 Modality: individual  2. Appropriately grieve the loss in order to normalize mood and to return to previously adaptive level of functioning. 3. Decrease the level of present conflict with parents while beginning to let go of or resolving past conflicts with them. Objective Describe the conflicts and the causes of conflicts between self and parents. Target Date: 2023-02-23 Frequency: Weekly  Progress: 0 Modality: individual  Related Interventions Give verbal permission for the client to have and express own feelings, thoughts, and perspectives in  order to foster a sense of autonomy from family. Explore the nature of the client's family conflicts and their perceived causes. Objective Identify own as well as others' role in the family conflicts. Target Date: 2023-02-23 Frequency: Weekly  Progress: 0 Modality: individual  Related Interventions Confront the client when he/she is not taking responsibility for his/her role  in the family conflict and reinforce the client for owning responsibility for his/her contribution to the conflict. Objective Increase the level of independent functioning. Target Date: 2023-02-23 Frequency: Weekly  Progress: 0 Modality: individual  Related Interventions Confront the client's emotional dependence and avoidance of economic responsibility that promotes continuing pattern of living with parents; develop a plan for the client's healthy and responsible emancipation from parents that is, if possible, complete with their blessing (e.g., finding and keeping a job, saving money, socializing with friends, finding own housing, etc.). Objective Report an increase in resolving conflicts with parents by talking calmly and assertively rather than aggressively and defensively. Target Date: 2023-02-23 Frequency: Weekly  Progress: 0 Modality: individual  Related Interventions Use role-playing, role reversal, modeling, and behavioral rehearsal to help the client develop assertive ways to resolve conflict with parents (recommend Your Perfect Right: Assertiveness and Equality in Your Life and Relationships by Neldon Newport). 4. Develop coping strategies (e.g., feeling identification, problem-solving, assertiveness) to address emotional issues that could lead to relapse of the eating disorder. 5. Develop healthy cognitive patterns and beliefs about self that lead to positive identity and prevent a relapse of the eating disorder. 6. Develop healthy interpersonal relationships that lead to alleviation and help prevent the  relapse of the eating disorder. 7. Develop healthy interpersonal relationships that lead to the alleviation and help prevent the relapse of depression. 8. Develop healthy thinking patterns and beliefs about self, others, and the world that lead to the alleviation and help prevent the relapse of depression. 9. Enhance ability to effectively cope with the full variety of life's worries and anxieties. 10. Learn and implement coping skills that result in a reduction of anxiety and worry, and improved daily functioning. 11. Reach a level of reduced tension, increased satisfaction, and improved communication with family and/or other authority figures. 12. Recognize, accept, and cope with feelings of depression. 13. Restore normal eating patterns, healthy weight maintenance, and a realistic appraisal of body size. Objective Honestly describe the pattern of eating including types, amounts, and frequency of food consumed or hoarded. Target Date: 2023-02-23 Frequency: Weekly  Progress: 0 Modality: individual  Related Interventions Assess the historical course of the disorder including the amount, type, and pattern of the client's food intake (e.g., too little food, too much food, binge eating, or hoarding food); perceived personal and interpersonal triggers and personal goals. Objective Establish regular eating patterns by eating at regular intervals and consuming optimal daily calories. Target Date: 2023-02-23 Frequency: Weekly  Progress: 0 Modality: individual  Related Interventions Establish healthy weight goals for the client per the Body Mass Index (BMI), the [place] Height and Weight Tables, or some other recognized standard. Objective Learn and implement skills for managing urges to engage in unhealthy eating or weight loss practices. Target Date: 2023-02-23 Frequency: Weekly  Progress: 0 Modality: individual  Related Interventions Teach the client tailored skills to manage high-risk  situations including distraction, positive self-talk, problem-solving, conflict resolution (e.g., empathy, active listening, "I messages," respectful communication, assertiveness without aggression, compromise), or other social/ communication skills; use modeling, role-playing, and behavior rehearsal to work through several current situations. Objective State a basis for positive identity that is not based on weight and appearance but on character, traits, relationships, and intrinsic value. Target Date: 2023-02-23 Frequency: Weekly  Progress: 0 Modality: individual  Related Interventions Assist the client in identifying a basis for self-worth apart from body image by reviewing his/her talents, successes, positive traits, importance to others, and intrinsic spiritual value. Objective Verbalize  an understanding of relapse prevention and the distinction between a lapse and a relapse. Target Date: 2023-02-23 Frequency: Weekly  Progress: 0 Modality: individual  Related Interventions Discuss with the client the distinction between a lapse and relapse, associating a lapse with an initial and reversible return of distress, urges, or to avoid, and relapse with the decision to return to the cycle of maladaptive thoughts and actions (e.g., feeling anxious, binging, then purging). Identify with the client future situations or circumstances in which lapses could occur. 14. Stabilize anxiety level while increasing ability to function on a daily basis. Objective Learn and implement problem-solving strategies for realistically addressing worries. Target Date: 2023-02-23 Frequency: Weekly  Progress: 0 Modality: individual  Related Interventions Teach the client problem-solving strategies involving specifically defining a problem, generating options for addressing it, evaluating the pros and cons of each option, selecting and implementing an optional action, and reevaluating and refining the action (or assign  "Applying Problem-Solving to Interpersonal Conflict" in the Adult Psychotherapy Homework Planner by Bryn Gulling). Objective Identify, challenge, and replace biased, fearful self-talk with positive, realistic, and empowering self-talk. Target Date: 2023-02-23 Frequency: Weekly  Progress: 0 Modality: individual  Related Interventions Explore the client's schema and self-talk that mediate his/her fear response; assist him/her in challenging the biases; replace the distorted messages with reality-based alternatives and positive, realistic self-talk that will increase his/her self-confidence in coping with irrational fears (see Cognitive Therapy of Anxiety Disorders by Alison Stalling). Assign the client a homework exercise in which he/she identifies fearful self-talk, identifies biases in the self-talk, generates alternatives, and tests through behavioral experiments (or assign "Negative Thoughts Trigger Negative Feelings" in the Adult Psychotherapy Homework Planner by [facility]); review and reinforce success, providing corrective feedback toward improvement. Objective Learn to accept limitations in life and commit to tolerating, rather than avoiding, unpleasant emotions while accomplishing meaningful goals. Target Date: 2023-02-23 Frequency: Weekly  Progress: 0 Modality: individual  Related Interventions Use techniques from Acceptance and Commitment Therapy to help client accept uncomfortable realities such as lack of complete control, imperfections, and uncertainty and tolerate unpleasant emotions and thoughts in order to accomplish value-consistent goals. Objective Learn and implement personal and interpersonal skills to reduce anxiety and improve interpersonal relationships. Target Date: 2023-02-23 Frequency: Weekly  Progress: 0 Modality: individual  Related Interventions Use instruction, modeling, and role-playing to build the client's general social, communication, and/or conflict resolution  skills. Objective Maintain involvement in work, family, and social activities. Target Date: 2023-02-23 Frequency: Weekly  Progress: 0 Modality: individual  Related Interventions Support the client in following through with work, family, and social activities rather than escaping or avoiding them to focus on anxiety. 15. Terminate overeating and implement lifestyle changes that lead to weight loss and improved health. 16. Terminate the pattern of binge eating and purging behavior with a return to eating normal amounts of nutritious foods.  Diagnosis:MDD (major depressive disorder), single episode, moderate ([place])  Anxiety  Plan:  -pt has Clinician phone number if needing any urgent support between sessions. -pt reminded of how to access [phone] or go to nearest ED if feeling suicidal or homocidal and unable to remain safe. -pt reports he will not do anything to hurt himself or anyone else and will call for assistance as needed. -meet again on Thursday, May 05, 2022 at 5pm.

## 2022-04-18 ENCOUNTER — Ambulatory Visit: Payer: BC Managed Care – PPO | Admitting: Professional

## 2022-04-28 ENCOUNTER — Ambulatory Visit: Payer: BC Managed Care – PPO | Admitting: Professional

## 2022-05-05 ENCOUNTER — Ambulatory Visit (INDEPENDENT_AMBULATORY_CARE_PROVIDER_SITE_OTHER): Payer: BC Managed Care – PPO | Admitting: Professional

## 2022-05-05 ENCOUNTER — Encounter: Payer: Self-pay | Admitting: Professional

## 2022-05-05 DIAGNOSIS — F321 Major depressive disorder, single episode, moderate: Secondary | ICD-10-CM

## 2022-05-05 DIAGNOSIS — F419 Anxiety disorder, unspecified: Secondary | ICD-10-CM | POA: Diagnosis not present

## 2022-05-05 NOTE — Progress Notes (Signed)
[place] [facility] Counselor/Therapist Progress Note  Patient ID: Jerry Cisneros, MRN: [id number],    Date: 05/05/2022  Time Spent: 56 minutes [id number]  Treatment Type: Individual Therapy  Risk Assessment: Danger to Self:  No Self-injurious Behavior: No Danger to Others: No  Subjective: The patient arrived early for his in-person session.   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 Alleviate depressive symptoms and return to previous level of effective functioning. Objective Describe current and past experiences with depression including their impact on functioning and attempts to resolve it. Target Date: 2023-02-23 Frequency: Weekly  Progress: 0 Modality: individual  Related Interventions Encourage the client to share his/her thoughts and feelings of depression; express empathy and build rapport while identifying primary cognitive, behavioral, interpersonal, or other contributors to depression. Assess current and past mood episodes including their features, frequency, severity, and duration (e.g., clinical interview supplemented by the Inventory to Diagnose Depression). Objective Identify and replace thoughts and beliefs that support depression. Target Date: 2023-02-23 Frequency: Weekly  Progress: 0 Modality: individual  Related Interventions Conduct  Cognitive-Behavioral Therapy (see Cognitive Behavior Therapy by Olevia Bowens; Overcoming Depression by Lynita Lombard al.), beginning with helping the client learn the connection among cognition, depressive feelings, and actions. Assign the client to self-monitor thoughts, feelings, and actions in daily journal (e.g., "Negative Thoughts Trigger Negative Feelings" in the Adult Psychotherapy Homework Planner by Bryn Gulling; "Daily Record of Dysfunctional Thoughts" in Cognitive Therapy of Depression by Lupita Shutter and Warren Lacy); process the journal material to challenge depressive thinking patterns and replace them with reality-based thoughts. Assign "behavioral experiments" in which depressive automatic thoughts are treated as hypotheses/prediction, reality-based alternative hypotheses/prediction are generated, and both are tested against the client's past, present, and/or future experiences. Facilitate and reinforce the client's shift from biased depressive self-talk and beliefs to reality-based cognitive messages that enhance self-confidence and increase adaptive actions (see "Positive Self-Talk" in the Adult Psychotherapy Homework Planner by Bryn Gulling). Objective Learn and implement behavioral strategies to overcome depression. Target Date: 2023-02-23 Frequency: Weekly  Progress: 0 Modality: individual  Related Interventions Engage the client in "behavioral activation," increasing his/her activity level and contact with sources of reward, while identifying processes that inhibit activation (see Behavioral Activation for Depression by Beverly Gust, Dimidjian, and Herman-Dunn; or assign "Identify and Schedule Pleasant Activities" in the Adult Psychotherapy Homework Planner by [facility]); use behavioral techniques such as instruction, rehearsal, role-playing, role reversal, as needed, to facilitate activity in the client's daily life; reinforce success. Assist the client in developing skills that increase the likelihood of  deriving pleasure from behavioral activation (e.g., assertiveness skills, developing an exercise plan, less internal/more external focus, increased social involvement); reinforce success. Objective Verbalize an understanding and resolution of current interpersonal problems. Target Date: 2023-02-23 Frequency: Weekly  Progress: 0 Modality: individual  Related Interventions For role transitions (e.g., beginning or ending a relationship or career, moving, promotion, retirement, graduation), help the client mourn the loss of the  old role while recognizing positive and negative aspects of the new role, and taking steps to gain mastery over the new role. Objective Learn and implement problem-solving and decision-making skills. Target Date: 2023-02-23 Frequency: Weekly  Progress: 0 Modality: individual  Objective Learn and implement conflict resolution skills to resolve interpersonal problems. Target Date: 2023-02-23 Frequency: Weekly  Progress: 0 Modality: individual  Related Interventions Teach conflict resolution skills (e.g., empathy, active listening, "I messages," respectful communication, assertiveness without aggression, compromise); use psychoeducation, modeling, role-playing, and rehearsal to work through several current conflicts; assign homework exercises; review and repeat so as to integrate their use into the client's life. Objective Provide behavioral, emotional, and attitudinal information toward an assessment of specifiers relevant to a DSM diagnosis, the efficacy of treatment, and the nature of the therapy relationship. Target Date: 2023-02-23 Frequency: Weekly  Progress: 0 Modality: individual  2. Appropriately grieve the loss in order to normalize mood and to return to previously adaptive level of functioning. 3. Decrease the level of present conflict with parents while beginning to let go of or resolving past conflicts with them. Objective Describe the conflicts and the causes of  conflicts between self and parents. Target Date: 2023-02-23 Frequency: Weekly  Progress: 0 Modality: individual  Related Interventions Give verbal permission for the client to have and express own feelings, thoughts, and perspectives in order to foster a sense of autonomy from family. Explore the nature of the client's family conflicts and their perceived causes. Objective Identify own as well as others' role in the family conflicts. Target Date: 2023-02-23 Frequency: Weekly  Progress: 0 Modality: individual  Related Interventions Confront the client when he/she is not taking responsibility for his/her role in the family conflict and reinforce the client for owning responsibility for his/her contribution to the conflict. Objective Increase the level of independent functioning. Target Date: 2023-02-23 Frequency: Weekly  Progress: 0 Modality: individual  Related Interventions Confront the client's emotional dependence and avoidance of economic responsibility that promotes continuing pattern of living with parents; develop a plan for the client's healthy and responsible emancipation from parents that is, if possible, complete with their blessing (e.g., finding and keeping a job, saving money, socializing with friends, finding own housing, etc.). Objective Report an increase in resolving conflicts with parents by talking calmly and assertively rather than aggressively and defensively. Target Date: 2023-02-23 Frequency: Weekly  Progress: 0 Modality: individual  Related Interventions Use role-playing, role reversal, modeling, and behavioral rehearsal to help the client develop assertive ways to resolve conflict with parents (recommend Your Perfect Right: Assertiveness and Equality in Your Life and Relationships by Neldon Newport). 4. Develop coping strategies (e.g., feeling identification, problem-solving, assertiveness) to address emotional issues that could lead to relapse of the eating  disorder. 5. Develop healthy cognitive patterns and beliefs about self that lead to positive identity and prevent a relapse of the eating disorder. 6. Develop healthy interpersonal relationships that lead to alleviation and help prevent the relapse of the eating disorder. 7. Develop healthy interpersonal relationships that lead to the alleviation and help prevent the relapse of depression. 8. Develop healthy thinking patterns and beliefs about self, others, and the world that lead to the alleviation and help prevent the relapse of depression. 9. Enhance ability to effectively cope with the full variety of life's worries and anxieties. 10. Learn and implement coping skills that result in a reduction of anxiety and worry, and improved daily functioning. 11. Reach a level of reduced tension, increased satisfaction, and improved communication with family  and/or other authority figures. 12. Recognize, accept, and cope with feelings of depression. 13. Restore normal eating patterns, healthy weight maintenance, and a realistic appraisal of body size. Objective Honestly describe the pattern of eating including types, amounts, and frequency of food consumed or hoarded. Target Date: 2023-02-23 Frequency: Weekly  Progress: 0 Modality: individual  Related Interventions Assess the historical course of the disorder including the amount, type, and pattern of the client's food intake (e.g., too little food, too much food, binge eating, or hoarding food); perceived personal and interpersonal triggers and personal goals. Objective Establish regular eating patterns by eating at regular intervals and consuming optimal daily calories. Target Date: 2023-02-23 Frequency: Weekly  Progress: 0 Modality: individual  Related Interventions Establish healthy weight goals for the client per the Body Mass Index (BMI), the [place] Height and Weight Tables, or some other recognized standard. Objective Learn and implement  skills for managing urges to engage in unhealthy eating or weight loss practices. Target Date: 2023-02-23 Frequency: Weekly  Progress: 0 Modality: individual  Related Interventions Teach the client tailored skills to manage high-risk situations including distraction, positive self-talk, problem-solving, conflict resolution (e.g., empathy, active listening, "I messages," respectful communication, assertiveness without aggression, compromise), or other social/ communication skills; use modeling, role-playing, and behavior rehearsal to work through several current situations. Objective State a basis for positive identity that is not based on weight and appearance but on character, traits, relationships, and intrinsic value. Target Date: 2023-02-23 Frequency: Weekly  Progress: 0 Modality: individual  Related Interventions Assist the client in identifying a basis for self-worth apart from body image by reviewing his/her talents, successes, positive traits, importance to others, and intrinsic spiritual value. Objective Verbalize an understanding of relapse prevention and the distinction between a lapse and a relapse. Target Date: 2023-02-23 Frequency: Weekly  Progress: 0 Modality: individual  Related Interventions Discuss with the client the distinction between a lapse and relapse, associating a lapse with an initial and reversible return of distress, urges, or to avoid, and relapse with the decision to return to the cycle of maladaptive thoughts and actions (e.g., feeling anxious, binging, then purging). Identify with the client future situations or circumstances in which lapses could occur. 14. Stabilize anxiety level while increasing ability to function on a daily basis. Objective Learn and implement problem-solving strategies for realistically addressing worries. Target Date: 2023-02-23 Frequency: Weekly  Progress: 0 Modality: individual  Related Interventions Teach the client problem-solving  strategies involving specifically defining a problem, generating options for addressing it, evaluating the pros and cons of each option, selecting and implementing an optional action, and reevaluating and refining the action (or assign "Applying Problem-Solving to Interpersonal Conflict" in the Adult Psychotherapy Homework Planner by Bryn Gulling). Objective Identify, challenge, and replace biased, fearful self-talk with positive, realistic, and empowering self-talk. Target Date: 2023-02-23 Frequency: Weekly  Progress: 0 Modality: individual  Related Interventions Explore the client's schema and self-talk that mediate his/her fear response; assist him/her in challenging the biases; replace the distorted messages with reality-based alternatives and positive, realistic self-talk that will increase his/her self-confidence in coping with irrational fears (see Cognitive Therapy of Anxiety Disorders by Alison Stalling). Assign the client a homework exercise in which he/she identifies fearful self-talk, identifies biases in the self-talk, generates alternatives, and tests through behavioral experiments (or assign "Negative Thoughts Trigger Negative Feelings" in the Adult Psychotherapy Homework Planner by [facility]); review and reinforce success, providing corrective feedback toward improvement. Objective Learn to accept limitations in life and commit to tolerating, rather than avoiding,  unpleasant emotions while accomplishing meaningful goals. Target Date: 2023-02-23 Frequency: Weekly  Progress: 0 Modality: individual  Related Interventions Use techniques from Acceptance and Commitment Therapy to help client accept uncomfortable realities such as lack of complete control, imperfections, and uncertainty and tolerate unpleasant emotions and thoughts in order to accomplish value-consistent goals. Objective Learn and implement personal and interpersonal skills to reduce anxiety and improve interpersonal  relationships. Target Date: 2023-02-23 Frequency: Weekly  Progress: 0 Modality: individual  Related Interventions Use instruction, modeling, and role-playing to build the client's general social, communication, and/or conflict resolution skills. Objective Maintain involvement in work, family, and social activities. Target Date: 2023-02-23 Frequency: Weekly  Progress: 0 Modality: individual  Related Interventions Support the client in following through with work, family, and social activities rather than escaping or avoiding them to focus on anxiety. 15. Terminate overeating and implement lifestyle changes that lead to weight loss and improved health. 16. Terminate the pattern of binge eating and purging behavior with a return to eating normal amounts of nutritious foods.  Diagnosis:MDD (major depressive disorder), single episode, moderate ([place])  Anxiety  Plan:  -read article on "How does sleep effect my eating habits?" -meet again on Thursday, May 12, 2022 at 5pm.

## 2022-05-12 ENCOUNTER — Ambulatory Visit: Payer: BC Managed Care – PPO | Admitting: Professional

## 2022-05-12 ENCOUNTER — Encounter: Payer: Self-pay | Admitting: Professional

## 2022-05-12 DIAGNOSIS — F321 Major depressive disorder, single episode, moderate: Secondary | ICD-10-CM | POA: Diagnosis not present

## 2022-05-12 DIAGNOSIS — F419 Anxiety disorder, unspecified: Secondary | ICD-10-CM | POA: Diagnosis not present

## 2022-05-12 NOTE — Progress Notes (Signed)
[place] Counselor/Therapist Progress Note  Patient ID: Jerry Cisneros, MRN: [id number],    Date: 05/12/2022  Time Spent: 55 minutes 305-4pm  Treatment Type: Individual Therapy  Risk Assessment: Danger to Self:  No Self-injurious Behavior: No Danger to Others: No  Subjective: The patient arrived on time for his in-person session.   Issues addressed: 1-professional a-has felt less stressed by work since workload has been lighter this week -he has kept himself appearing busy per manager request b-he is unsure of next steps for him but knows he would like to get a job working on computers c-he would like to get a degree in computers but is uncertain where he would attend -suggested pt explore options in [place] and consider how he would be able to get his work completed  2-social -would like to increase size of local friend group 3-future -five years from now would want to be out of his parents house either in his own apartment or one shared with roommates -he would like to be working in [employer] and making a good income -he would like to have a supportive friend group  Treatment Plan Problems Addressed  Anxiety, Eating Disorders And Obesity, Family Conflict, Unipolar Depression Goals 1. Alleviate depressive symptoms and return to previous level of effective functioning. Objective Describe current and past experiences with depression including their impact on functioning and attempts to resolve it. Target Date: 2023-02-23 Frequency: Weekly  Progress: 0 Modality: individual  Related Interventions Encourage the client to share his/her thoughts and feelings of depression; express empathy and build rapport while identifying primary cognitive, behavioral, interpersonal, or other contributors to depression. Assess current and past mood episodes including their features, frequency, severity, and duration (e.g., clinical interview supplemented by the Inventory to  Diagnose Depression). Objective Identify and replace thoughts and beliefs that support depression. Target Date: 2023-02-23 Frequency: Weekly  Progress: 0 Modality: individual  Related Interventions Conduct Cognitive-Behavioral Therapy (see Cognitive Behavior Therapy by Olevia Bowens; Overcoming Depression by Lynita Lombard al.), beginning with helping the client learn the connection among cognition, depressive feelings, and actions. Assign the client to self-monitor thoughts, feelings, and actions in daily journal (e.g., "Negative Thoughts Trigger Negative Feelings" in the Adult Psychotherapy Homework Planner by Bryn Gulling; "Daily Record of Dysfunctional Thoughts" in Cognitive Therapy of Depression by Lupita Shutter and Warren Lacy); process the journal material to challenge depressive thinking patterns and replace them with reality-based thoughts. Assign "behavioral experiments" in which depressive automatic thoughts are treated as hypotheses/prediction, reality-based alternative hypotheses/prediction are generated, and both are tested against the client's past, present, and/or future experiences. Facilitate and reinforce the client's shift from biased depressive self-talk and beliefs to reality-based cognitive messages that enhance self-confidence and increase adaptive actions (see "Positive Self-Talk" in the Adult Psychotherapy Homework Planner by Bryn Gulling). Objective Learn and implement behavioral strategies to overcome depression. Target Date: 2023-02-23 Frequency: Weekly  Progress: 0 Modality: individual  Related Interventions Engage the client in "behavioral activation," increasing his/her activity level and contact with sources of reward, while identifying processes that inhibit activation (see Behavioral Activation for Depression by Beverly Gust, Dimidjian, and Herman-Dunn; or assign "Identify and Schedule Pleasant Activities" in the Adult Psychotherapy Homework Planner by [facility]); use behavioral techniques such as  instruction, rehearsal, role-playing, role reversal, as needed, to facilitate activity in the client's daily life; reinforce success. Assist the client in developing skills that increase the likelihood of deriving pleasure from behavioral activation (e.g., assertiveness skills, developing an exercise plan, less internal/more external focus, increased social involvement); reinforce success.  Objective Verbalize an understanding and resolution of current interpersonal problems. Target Date: 2023-02-23 Frequency: Weekly  Progress: 0 Modality: individual  Related Interventions For role transitions (e.g., beginning or ending a relationship or career, moving, promotion, retirement, graduation), help the client mourn the loss of the old role while recognizing positive and negative aspects of the new role, and taking steps to gain mastery over the new role. Objective Learn and implement problem-solving and decision-making skills. Target Date: 2023-02-23 Frequency: Weekly  Progress: 0 Modality: individual  Objective Learn and implement conflict resolution skills to resolve interpersonal problems. Target Date: 2023-02-23 Frequency: Weekly  Progress: 0 Modality: individual  Related Interventions Teach conflict resolution skills (e.g., empathy, active listening, "I messages," respectful communication, assertiveness without aggression, compromise); use psychoeducation, modeling, role-playing, and rehearsal to work through several current conflicts; assign homework exercises; review and repeat so as to integrate their use into the client's life. Objective Provide behavioral, emotional, and attitudinal information toward an assessment of specifiers relevant to a DSM diagnosis, the efficacy of treatment, and the nature of the therapy relationship. Target Date: 2023-02-23 Frequency: Weekly  Progress: 0 Modality: individual  2. Appropriately grieve the loss in order to normalize mood and to return to previously  adaptive level of functioning. 3. Decrease the level of present conflict with parents while beginning to let go of or resolving past conflicts with them. Objective Describe the conflicts and the causes of conflicts between self and parents. Target Date: 2023-02-23 Frequency: Weekly  Progress: 0 Modality: individual  Related Interventions Give verbal permission for the client to have and express own feelings, thoughts, and perspectives in order to foster a sense of autonomy from family. Explore the nature of the client's family conflicts and their perceived causes. Objective Identify own as well as others' role in the family conflicts. Target Date: 2023-02-23 Frequency: Weekly  Progress: 0 Modality: individual  Related Interventions Confront the client when he/she is not taking responsibility for his/her role in the family conflict and reinforce the client for owning responsibility for his/her contribution to the conflict. Objective Increase the level of independent functioning. Target Date: 2023-02-23 Frequency: Weekly  Progress: 0 Modality: individual  Related Interventions Confront the client's emotional dependence and avoidance of economic responsibility that promotes continuing pattern of living with parents; develop a plan for the client's healthy and responsible emancipation from parents that is, if possible, complete with their blessing (e.g., finding and keeping a job, saving money, socializing with friends, finding own housing, etc.). Objective Report an increase in resolving conflicts with parents by talking calmly and assertively rather than aggressively and defensively. Target Date: 2023-02-23 Frequency: Weekly  Progress: 0 Modality: individual  Related Interventions Use role-playing, role reversal, modeling, and behavioral rehearsal to help the client develop assertive ways to resolve conflict with parents (recommend Your Perfect Right: Assertiveness and Equality in Your Life  and Relationships by Neldon Newport). 4. Develop coping strategies (e.g., feeling identification, problem-solving, assertiveness) to address emotional issues that could lead to relapse of the eating disorder. 5. Develop healthy cognitive patterns and beliefs about self that lead to positive identity and prevent a relapse of the eating disorder. 6. Develop healthy interpersonal relationships that lead to alleviation and help prevent the relapse of the eating disorder. 7. Develop healthy interpersonal relationships that lead to the alleviation and help prevent the relapse of depression. 8. Develop healthy thinking patterns and beliefs about self, others, and the world that lead to the alleviation and help prevent the relapse of depression. 9. Enhance  ability to effectively cope with the full variety of life's worries and anxieties. 10. Learn and implement coping skills that result in a reduction of anxiety and worry, and improved daily functioning. 11. Reach a level of reduced tension, increased satisfaction, and improved communication with family and/or other authority figures. 12. Recognize, accept, and cope with feelings of depression. 13. Restore normal eating patterns, healthy weight maintenance, and a realistic appraisal of body size. Objective Honestly describe the pattern of eating including types, amounts, and frequency of food consumed or hoarded. Target Date: 2023-02-23 Frequency: Weekly  Progress: 0 Modality: individual  Related Interventions Assess the historical course of the disorder including the amount, type, and pattern of the client's food intake (e.g., too little food, too much food, binge eating, or hoarding food); perceived personal and interpersonal triggers and personal goals. Objective Establish regular eating patterns by eating at regular intervals and consuming optimal daily calories. Target Date: 2023-02-23 Frequency: Weekly  Progress: 0 Modality: individual   Related Interventions Establish healthy weight goals for the client per the Body Mass Index (BMI), the [place] Height and Weight Tables, or some other recognized standard. Objective Learn and implement skills for managing urges to engage in unhealthy eating or weight loss practices. Target Date: 2023-02-23 Frequency: Weekly  Progress: 0 Modality: individual  Related Interventions Teach the client tailored skills to manage high-risk situations including distraction, positive self-talk, problem-solving, conflict resolution (e.g., empathy, active listening, "I messages," respectful communication, assertiveness without aggression, compromise), or other social/ communication skills; use modeling, role-playing, and behavior rehearsal to work through several current situations. Objective State a basis for positive identity that is not based on weight and appearance but on character, traits, relationships, and intrinsic value. Target Date: 2023-02-23 Frequency: Weekly  Progress: 0 Modality: individual  Related Interventions Assist the client in identifying a basis for self-worth apart from body image by reviewing his/her talents, successes, positive traits, importance to others, and intrinsic spiritual value. Objective Verbalize an understanding of relapse prevention and the distinction between a lapse and a relapse. Target Date: 2023-02-23 Frequency: Weekly  Progress: 0 Modality: individual  Related Interventions Discuss with the client the distinction between a lapse and relapse, associating a lapse with an initial and reversible return of distress, urges, or to avoid, and relapse with the decision to return to the cycle of maladaptive thoughts and actions (e.g., feeling anxious, binging, then purging). Identify with the client future situations or circumstances in which lapses could occur. 14. Stabilize anxiety level while increasing ability to function on a daily basis. Objective Learn and  implement problem-solving strategies for realistically addressing worries. Target Date: 2023-02-23 Frequency: Weekly  Progress: 0 Modality: individual  Related Interventions Teach the client problem-solving strategies involving specifically defining a problem, generating options for addressing it, evaluating the pros and cons of each option, selecting and implementing an optional action, and reevaluating and refining the action (or assign "Applying Problem-Solving to Interpersonal Conflict" in the Adult Psychotherapy Homework Planner by Bryn Gulling). Objective Identify, challenge, and replace biased, fearful self-talk with positive, realistic, and empowering self-talk. Target Date: 2023-02-23 Frequency: Weekly  Progress: 0 Modality: individual  Related Interventions Explore the client's schema and self-talk that mediate his/her fear response; assist him/her in challenging the biases; replace the distorted messages with reality-based alternatives and positive, realistic self-talk that will increase his/her self-confidence in coping with irrational fears (see Cognitive Therapy of Anxiety Disorders by Alison Stalling). Assign the client a homework exercise in which he/she identifies fearful self-talk, identifies biases in the  self-talk, generates alternatives, and tests through behavioral experiments (or assign "Negative Thoughts Trigger Negative Feelings" in the Adult Psychotherapy Homework Planner by [facility]); review and reinforce success, providing corrective feedback toward improvement. Objective Learn to accept limitations in life and commit to tolerating, rather than avoiding, unpleasant emotions while accomplishing meaningful goals. Target Date: 2023-02-23 Frequency: Weekly  Progress: 0 Modality: individual  Related Interventions Use techniques from Acceptance and Commitment Therapy to help client accept uncomfortable realities such as lack of complete control, imperfections, and uncertainty and  tolerate unpleasant emotions and thoughts in order to accomplish value-consistent goals. Objective Learn and implement personal and interpersonal skills to reduce anxiety and improve interpersonal relationships. Target Date: 2023-02-23 Frequency: Weekly  Progress: 0 Modality: individual  Related Interventions Use instruction, modeling, and role-playing to build the client's general social, communication, and/or conflict resolution skills. Objective Maintain involvement in work, family, and social activities. Target Date: 2023-02-23 Frequency: Weekly  Progress: 0 Modality: individual  Related Interventions Support the client in following through with work, family, and social activities rather than escaping or avoiding them to focus on anxiety. 15. Terminate overeating and implement lifestyle changes that lead to weight loss and improved health. 16. Terminate the pattern of binge eating and purging behavior with a return to eating normal amounts of nutritious foods.  Diagnosis:MDD (major depressive disorder), single episode, moderate ([place])  Anxiety  Plan:  -move to bi-weekly appointments -meet again on Monday, May 23 2022 at [facility].

## 2022-05-19 ENCOUNTER — Ambulatory Visit: Payer: BC Managed Care – PPO | Admitting: Professional

## 2022-05-23 ENCOUNTER — Ambulatory Visit: Payer: BC Managed Care – PPO | Admitting: Professional

## 2022-06-02 ENCOUNTER — Ambulatory Visit: Payer: BC Managed Care – PPO | Admitting: Professional

## 2022-06-09 ENCOUNTER — Ambulatory Visit: Payer: BC Managed Care – PPO | Admitting: Professional

## 2022-06-09 ENCOUNTER — Encounter: Payer: Self-pay | Admitting: Professional

## 2022-06-09 DIAGNOSIS — F321 Major depressive disorder, single episode, moderate: Secondary | ICD-10-CM | POA: Diagnosis not present

## 2022-06-09 DIAGNOSIS — F419 Anxiety disorder, unspecified: Secondary | ICD-10-CM | POA: Diagnosis not present

## 2022-06-09 NOTE — Progress Notes (Signed)
[place] Counselor/Therapist Progress Note  Patient ID: Jerry Cisneros, MRN: [id number],    Date: 06/09/2022  Time Spent: 53 minutes [id number]  Treatment Type: Individual Therapy  Risk Assessment: Danger to Self:  No Self-injurious Behavior: No Danger to Others: No  Subjective: The patient arrived on time for his in-person session.   Issues addressed: 1-professional a- two tough workdays due to poor sleep -he made some mistakes today that he would not normally ;make b-he has made some critical errors in past couple weeks -not contacting customer about a damaged product -small mistakes include other people getting involved -pt became careless in checking the sku's and mislabeled a product 2-trouble sleeping -blinds in his room are broken and paper thin -goes to bed hen still daylight and light interrupts -he has bought some new blinds but is reluctant to complete due to fear he will not complete -he feels nervous that they will not work well -discussed irrational thinking and how it impact worry -pt able to follow work schedule all week long but gets good weekends 3-not reaching the goals he set out -he has had some weight gain in the past few weeks   -waking up late and not having enough time to make breakfast or lunch and grabs fast food -strategies for successful meal planning and packing lunches  Treatment Plan Problems Addressed  Anxiety, Eating Disorders And Obesity, Family Conflict, Unipolar Depression Goals 1. Alleviate depressive symptoms and return to previous level of effective functioning. Objective Describe current and past experiences with depression including their impact on functioning and attempts to resolve it. Target Date: 2023-02-23 Frequency: Weekly  Progress: 0 Modality: individual  Related Interventions Encourage the client to share his/her thoughts and feelings of depression; express empathy and build rapport while identifying  primary cognitive, behavioral, interpersonal, or other contributors to depression. Assess current and past mood episodes including their features, frequency, severity, and duration (e.g., clinical interview supplemented by the Inventory to Diagnose Depression). Objective Identify and replace thoughts and beliefs that support depression. Target Date: 2023-02-23 Frequency: Weekly  Progress: 0 Modality: individual  Related Interventions Conduct Cognitive-Behavioral Therapy (see Cognitive Behavior Therapy by Olevia Bowens; Overcoming Depression by Lynita Lombard al.), beginning with helping the client learn the connection among cognition, depressive feelings, and actions. Assign the client to self-monitor thoughts, feelings, and actions in daily journal (e.g., "Negative Thoughts Trigger Negative Feelings" in the Adult Psychotherapy Homework Planner by Bryn Gulling; "Daily Record of Dysfunctional Thoughts" in Cognitive Therapy of Depression by Lupita Shutter and Warren Lacy); process the journal material to challenge depressive thinking patterns and replace them with reality-based thoughts. Assign "behavioral experiments" in which depressive automatic thoughts are treated as hypotheses/prediction, reality-based alternative hypotheses/prediction are generated, and both are tested against the client's past, present, and/or future experiences. Facilitate and reinforce the client's shift from biased depressive self-talk and beliefs to reality-based cognitive messages that enhance self-confidence and increase adaptive actions (see "Positive Self-Talk" in the Adult Psychotherapy Homework Planner by Bryn Gulling). Objective Learn and implement behavioral strategies to overcome depression. Target Date: 2023-02-23 Frequency: Weekly  Progress: 0 Modality: individual  Related Interventions Engage the client in "behavioral activation," increasing his/her activity level and contact with sources of reward, while identifying processes that  inhibit activation (see Behavioral Activation for Depression by Beverly Gust, Dimidjian, and Herman-Dunn; or assign "Identify and Schedule Pleasant Activities" in the Adult Psychotherapy Homework Planner by [facility]); use behavioral techniques such as instruction, rehearsal, role-playing, role reversal, as needed, to facilitate activity in the client's daily life; reinforce  success. Assist the client in developing skills that increase the likelihood of deriving pleasure from behavioral activation (e.g., assertiveness skills, developing an exercise plan, less internal/more external focus, increased social involvement); reinforce success. Objective Verbalize an understanding and resolution of current interpersonal problems. Target Date: 2023-02-23 Frequency: Weekly  Progress: 0 Modality: individual  Related Interventions For role transitions (e.g., beginning or ending a relationship or career, moving, promotion, retirement, graduation), help the client mourn the loss of the old role while recognizing positive and negative aspects of the new role, and taking steps to gain mastery over the new role. Objective Learn and implement problem-solving and decision-making skills. Target Date: 2023-02-23 Frequency: Weekly  Progress: 0 Modality: individual  Objective Learn and implement conflict resolution skills to resolve interpersonal problems. Target Date: 2023-02-23 Frequency: Weekly  Progress: 0 Modality: individual  Related Interventions Teach conflict resolution skills (e.g., empathy, active listening, "I messages," respectful communication, assertiveness without aggression, compromise); use psychoeducation, modeling, role-playing, and rehearsal to work through several current conflicts; assign homework exercises; review and repeat so as to integrate their use into the client's life. Objective Provide behavioral, emotional, and attitudinal information toward an assessment of specifiers relevant to a DSM  diagnosis, the efficacy of treatment, and the nature of the therapy relationship. Target Date: 2023-02-23 Frequency: Weekly  Progress: 0 Modality: individual  2. Appropriately grieve the loss in order to normalize mood and to return to previously adaptive level of functioning. 3. Decrease the level of present conflict with parents while beginning to let go of or resolving past conflicts with them. Objective Describe the conflicts and the causes of conflicts between self and parents. Target Date: 2023-02-23 Frequency: Weekly  Progress: 0 Modality: individual  Related Interventions Give verbal permission for the client to have and express own feelings, thoughts, and perspectives in order to foster a sense of autonomy from family. Explore the nature of the client's family conflicts and their perceived causes. Objective Identify own as well as others' role in the family conflicts. Target Date: 2023-02-23 Frequency: Weekly  Progress: 0 Modality: individual  Related Interventions Confront the client when he/she is not taking responsibility for his/her role in the family conflict and reinforce the client for owning responsibility for his/her contribution to the conflict. Objective Increase the level of independent functioning. Target Date: 2023-02-23 Frequency: Weekly  Progress: 0 Modality: individual  Related Interventions Confront the client's emotional dependence and avoidance of economic responsibility that promotes continuing pattern of living with parents; develop a plan for the client's healthy and responsible emancipation from parents that is, if possible, complete with their blessing (e.g., finding and keeping a job, saving money, socializing with friends, finding own housing, etc.). Objective Report an increase in resolving conflicts with parents by talking calmly and assertively rather than aggressively and defensively. Target Date: 2023-02-23 Frequency: Weekly  Progress: 0 Modality:  individual  Related Interventions Use role-playing, role reversal, modeling, and behavioral rehearsal to help the client develop assertive ways to resolve conflict with parents (recommend Your Perfect Right: Assertiveness and Equality in Your Life and Relationships by Neldon Newport). 4. Develop coping strategies (e.g., feeling identification, problem-solving, assertiveness) to address emotional issues that could lead to relapse of the eating disorder. 5. Develop healthy cognitive patterns and beliefs about self that lead to positive identity and prevent a relapse of the eating disorder. 6. Develop healthy interpersonal relationships that lead to alleviation and help prevent the relapse of the eating disorder. 7. Develop healthy interpersonal relationships that lead to the alleviation and  help prevent the relapse of depression. 8. Develop healthy thinking patterns and beliefs about self, others, and the world that lead to the alleviation and help prevent the relapse of depression. 9. Enhance ability to effectively cope with the full variety of life's worries and anxieties. 10. Learn and implement coping skills that result in a reduction of anxiety and worry, and improved daily functioning. 11. Reach a level of reduced tension, increased satisfaction, and improved communication with family and/or other authority figures. 12. Recognize, accept, and cope with feelings of depression. 13. Restore normal eating patterns, healthy weight maintenance, and a realistic appraisal of body size. Objective Honestly describe the pattern of eating including types, amounts, and frequency of food consumed or hoarded. Target Date: 2023-02-23 Frequency: Weekly  Progress: 0 Modality: individual  Related Interventions Assess the historical course of the disorder including the amount, type, and pattern of the client's food intake (e.g., too little food, too much food, binge eating, or hoarding food); perceived  personal and interpersonal triggers and personal goals. Objective Establish regular eating patterns by eating at regular intervals and consuming optimal daily calories. Target Date: 2023-02-23 Frequency: Weekly  Progress: 0 Modality: individual  Related Interventions Establish healthy weight goals for the client per the Body Mass Index (BMI), the [place] Height and Weight Tables, or some other recognized standard. Objective Learn and implement skills for managing urges to engage in unhealthy eating or weight loss practices. Target Date: 2023-02-23 Frequency: Weekly  Progress: 0 Modality: individual  Related Interventions Teach the client tailored skills to manage high-risk situations including distraction, positive self-talk, problem-solving, conflict resolution (e.g., empathy, active listening, "I messages," respectful communication, assertiveness without aggression, compromise), or other social/ communication skills; use modeling, role-playing, and behavior rehearsal to work through several current situations. Objective State a basis for positive identity that is not based on weight and appearance but on character, traits, relationships, and intrinsic value. Target Date: 2023-02-23 Frequency: Weekly  Progress: 0 Modality: individual  Related Interventions Assist the client in identifying a basis for self-worth apart from body image by reviewing his/her talents, successes, positive traits, importance to others, and intrinsic spiritual value. Objective Verbalize an understanding of relapse prevention and the distinction between a lapse and a relapse. Target Date: 2023-02-23 Frequency: Weekly  Progress: 0 Modality: individual  Related Interventions Discuss with the client the distinction between a lapse and relapse, associating a lapse with an initial and reversible return of distress, urges, or to avoid, and relapse with the decision to return to the cycle of maladaptive thoughts and  actions (e.g., feeling anxious, binging, then purging). Identify with the client future situations or circumstances in which lapses could occur. 14. Stabilize anxiety level while increasing ability to function on a daily basis. Objective Learn and implement problem-solving strategies for realistically addressing worries. Target Date: 2023-02-23 Frequency: Weekly  Progress: 0 Modality: individual  Related Interventions Teach the client problem-solving strategies involving specifically defining a problem, generating options for addressing it, evaluating the pros and cons of each option, selecting and implementing an optional action, and reevaluating and refining the action (or assign "Applying Problem-Solving to Interpersonal Conflict" in the Adult Psychotherapy Homework Planner by Bryn Gulling). Objective Identify, challenge, and replace biased, fearful self-talk with positive, realistic, and empowering self-talk. Target Date: 2023-02-23 Frequency: Weekly  Progress: 0 Modality: individual  Related Interventions Explore the client's schema and self-talk that mediate his/her fear response; assist him/her in challenging the biases; replace the distorted messages with reality-based alternatives and positive, realistic self-talk that will increase  his/her self-confidence in coping with irrational fears (see Cognitive Therapy of Anxiety Disorders by Alison Stalling). Assign the client a homework exercise in which he/she identifies fearful self-talk, identifies biases in the self-talk, generates alternatives, and tests through behavioral experiments (or assign "Negative Thoughts Trigger Negative Feelings" in the Adult Psychotherapy Homework Planner by [facility]); review and reinforce success, providing corrective feedback toward improvement. Objective Learn to accept limitations in life and commit to tolerating, rather than avoiding, unpleasant emotions while accomplishing meaningful goals. Target Date: 2023-02-23  Frequency: Weekly  Progress: 0 Modality: individual  Related Interventions Use techniques from Acceptance and Commitment Therapy to help client accept uncomfortable realities such as lack of complete control, imperfections, and uncertainty and tolerate unpleasant emotions and thoughts in order to accomplish value-consistent goals. Objective Learn and implement personal and interpersonal skills to reduce anxiety and improve interpersonal relationships. Target Date: 2023-02-23 Frequency: Weekly  Progress: 0 Modality: individual  Related Interventions Use instruction, modeling, and role-playing to build the client's general social, communication, and/or conflict resolution skills. Objective Maintain involvement in work, family, and social activities. Target Date: 2023-02-23 Frequency: Weekly  Progress: 0 Modality: individual  Related Interventions Support the client in following through with work, family, and social activities rather than escaping or avoiding them to focus on anxiety. 15. Terminate overeating and implement lifestyle changes that lead to weight loss and improved health. 16. Terminate the pattern of binge eating and purging behavior with a return to eating normal amounts of nutritious foods.  Diagnosis:MDD (major depressive disorder), single episode, moderate  Anxiety  Plan:  -meet again on Thursday, June 23 2022 at 3pm in-person

## 2022-06-23 ENCOUNTER — Encounter: Payer: Self-pay | Admitting: Professional

## 2022-06-23 ENCOUNTER — Ambulatory Visit: Payer: BC Managed Care – PPO | Admitting: Professional

## 2022-06-23 DIAGNOSIS — F321 Major depressive disorder, single episode, moderate: Secondary | ICD-10-CM

## 2022-06-23 DIAGNOSIS — F419 Anxiety disorder, unspecified: Secondary | ICD-10-CM | POA: Diagnosis not present

## 2022-06-23 NOTE — Progress Notes (Signed)
[facility] Counselor/Therapist Progress Note  Patient ID: Jerry Cisneros, MRN: [id number],    Date: 06/23/2022  Time Spent: 50 minutes [id number]  Treatment Type: Individual Therapy  Risk Assessment: Danger to Self:  No Self-injurious Behavior: No Danger to Others: No  Subjective: The patient arrived on time for his in-person session.   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 Alleviate depressive symptoms and return to previous level of effective functioning. Objective Describe current and past experiences with depression including their impact on functioning and attempts to resolve it. Target Date: 2023-02-23 Frequency: biweekly  Progress: 0 Modality: individual  Related Interventions Encourage the client to share his/her thoughts and feelings of depression; express empathy and build rapport while identifying primary cognitive, behavioral, interpersonal, or other contributors to depression. Assess current and past mood episodes including their features, frequency, severity, and duration (e.g., clinical interview supplemented by the Inventory to Diagnose Depression). Objective Identify and replace thoughts and  beliefs that support depression. Target Date: 2023-02-23 Frequency: biweekly  Progress: 0 Modality: individual  Related Interventions Conduct Cognitive-Behavioral Therapy (see Cognitive Behavior Therapy by Reola Calkins; Overcoming Depression by Agapito Games al.), beginning with helping the client learn the connection among cognition, depressive feelings, and actions. Assign the client to self-monitor thoughts, feelings, and actions in daily journal (e.g., "Negative Thoughts Trigger Negative Feelings" in the Adult Psychotherapy Homework Planner by Stephannie Li; "Daily Record of Dysfunctional Thoughts" in Cognitive Therapy of Depression by Ashby Dawes and Shea Evans); process the journal material to challenge depressive thinking patterns and replace them with reality-based thoughts. Assign "behavioral experiments" in which depressive automatic thoughts are treated as hypotheses/prediction, reality-based alternative hypotheses/prediction are generated, and both are tested against the client's past, present, and/or future experiences. Facilitate and reinforce the client's shift from biased depressive self-talk and beliefs to reality-based cognitive messages that enhance self-confidence and increase adaptive actions (see "Positive Self-Talk" in the Adult Psychotherapy Homework Planner by Stephannie Li). Objective Learn and implement behavioral strategies to overcome depression. Target Date: 2023-02-23 Frequency: biweekly  Progress: 0 Modality: individual  Related Interventions Engage the client in "behavioral activation," increasing his/her activity level and contact with sources of reward, while identifying processes that inhibit activation (see Behavioral Activation for Depression by Katharine Look, Dimidjian, and Herman-Dunn; or assign "Identify and Schedule Pleasant Activities" in the Adult Psychotherapy Homework Planner by [facility]); use behavioral techniques such as instruction, rehearsal, role-playing, role reversal, as needed,  to facilitate activity in the client's daily life; reinforce success. Assist the client in developing skills that increase the likelihood of deriving pleasure from behavioral activation (e.g., assertiveness skills, developing an exercise plan, less internal/more external focus, increased social involvement); reinforce success. Objective Verbalize an understanding and resolution of current interpersonal problems. Target Date: 2023-02-23 Frequency: biweekly  Progress: 0 Modality:  individual  Related Interventions For role transitions (e.g., beginning or ending a relationship or career, moving, promotion, retirement, graduation), help the client mourn the loss of the old role while recognizing positive and negative aspects of the new role, and taking steps to gain mastery over the new role. Objective Learn and implement problem-solving and decision-making skills. Target Date: 2023-02-23 Frequency: biweekly  Progress: 0 Modality: individual  Objective Learn and implement conflict resolution skills to resolve interpersonal problems. Target Date: 2023-02-23 Frequency: biweekly  Progress: 0 Modality: individual  Related Interventions Teach conflict resolution skills (e.g., empathy, active listening, "I messages," respectful communication, assertiveness without aggression, compromise); use psychoeducation, modeling, role-playing, and rehearsal to work through several current conflicts; assign homework exercises; review and repeat so as to integrate their use into the client's life. Objective Provide behavioral, emotional, and attitudinal information toward an assessment of specifiers relevant to a DSM diagnosis, the efficacy of treatment, and the nature of the therapy relationship. Target Date: 2023-02-23 Frequency: biweekly  Progress: 0 Modality: individual  2. Appropriately grieve the loss in order to normalize mood and to return to previously adaptive level of functioning. 3. Decrease the level of  present conflict with parents while beginning to let go of or resolving past conflicts with them. Objective Describe the conflicts and the causes of conflicts between self and parents. Target Date: 2023-02-23 Frequency: biweekly  Progress: 0 Modality: individual  Related Interventions Give verbal permission for the client to have and express own feelings, thoughts, and perspectives in order to foster a sense of autonomy from family. Explore the nature of the client's family conflicts and their perceived causes. Objective Identify own as well as others' role in the family conflicts. Target Date: 2023-02-23 Frequency: biweekly  Progress: 0 Modality: individual  Related Interventions Confront the client when he/she is not taking responsibility for his/her role in the family conflict and reinforce the client for owning responsibility for his/her contribution to the conflict. Objective Increase the level of independent functioning. Target Date: 2023-02-23 Frequency: biweekly  Progress: 0 Modality: individual  Related Interventions Confront the client's emotional dependence and avoidance of economic responsibility that promotes continuing pattern of living with parents; develop a plan for the client's healthy and responsible emancipation from parents that is, if possible, complete with their blessing (e.g., finding and keeping a job, saving money, socializing with friends, finding own housing, etc.). Objective Report an increase in resolving conflicts with parents by talking calmly and assertively rather than aggressively and defensively. Target Date: 2023-02-23 Frequency: biweekly  Progress: 0 Modality: individual  Related Interventions Use role-playing, role reversal, modeling, and behavioral rehearsal to help the client develop assertive ways to resolve conflict with parents (recommend Your Perfect Right: Assertiveness and Equality in Your Life and Relationships by Carnella Guadalajara). 4.  Develop coping strategies (e.g., feeling identification, problem-solving, assertiveness) to address emotional issues that could lead to relapse of the eating disorder. 5. Develop healthy cognitive patterns and beliefs about self that lead to positive identity and prevent a relapse of the eating disorder. 6. Develop healthy interpersonal relationships that lead to alleviation and help prevent the relapse of the eating disorder. 7. Develop healthy interpersonal relationships that lead to the alleviation and help prevent the relapse of depression. 8. Develop healthy thinking patterns and beliefs about self, others, and the world that lead to the alleviation and help prevent the relapse of depression. 9. Enhance ability to effectively cope with the full variety of life's worries and anxieties. 10. Learn and implement coping skills  that result in a reduction of anxiety and worry, and improved daily functioning. 11. Reach a level of reduced tension, increased satisfaction, and improved communication with family and/or other authority figures. 12. Recognize, accept, and cope with feelings of depression. 13. Restore normal eating patterns, healthy weight maintenance, and a realistic appraisal of body size. Objective Honestly describe the pattern of eating including types, amounts, and frequency of food consumed or hoarded. Target Date: 2023-02-23 Frequency: biweekly  Progress: 0 Modality: individual  Related Interventions Assess the historical course of the disorder including the amount, type, and pattern of the client's food intake (e.g., too little food, too much food, binge eating, or hoarding food); perceived personal and interpersonal triggers and personal goals. Objective Establish regular eating patterns by eating at regular intervals and consuming optimal daily calories. Target Date: 2023-02-23 Frequency: biweekly  Progress: 0 Modality: individual  Related Interventions Establish healthy weight  goals for the client per the Body Mass Index (BMI), the [employer] Height and Weight Tables, or some other recognized standard. Objective Learn and implement skills for managing urges to engage in unhealthy eating or weight loss practices. Target Date: 2023-02-23 Frequency: biweekly  Progress: 0 Modality: individual  Related Interventions Teach the client tailored skills to manage high-risk situations including distraction, positive self-talk, problem-solving, conflict resolution (e.g., empathy, active listening, "I messages," respectful communication, assertiveness without aggression, compromise), or other social/ communication skills; use modeling, role-playing, and behavior rehearsal to work through several current situations. Objective State a basis for positive identity that is not based on weight and appearance but on character, traits, relationships, and intrinsic value. Target Date: 2023-02-23 Frequency: biweekly  Progress: 0 Modality: individual  Related Interventions Assist the client in identifying a basis for self-worth apart from body image by reviewing his/her talents, successes, positive traits, importance to others, and intrinsic spiritual value. Objective Verbalize an understanding of relapse prevention and the distinction between a lapse and a relapse. Target Date: 2023-02-23 Frequency: biweekly  Progress: 0 Modality: individual  Related Interventions Discuss with the client the distinction between a lapse and relapse, associating a lapse with an initial and reversible return of distress, urges, or to avoid, and relapse with the decision to return to the cycle of maladaptive thoughts and actions (e.g., feeling anxious, binging, then purging). Identify with the client future situations or circumstances in which lapses could occur. 14. Stabilize anxiety level while increasing ability to function on a daily basis. Objective Learn and implement problem-solving strategies for  realistically addressing worries. Target Date: 2023-02-23 Frequency: biweekly  Progress: 0 Modality: individual  Related Interventions Teach the client problem-solving strategies involving specifically defining a problem, generating options for addressing it, evaluating the pros and cons of each option, selecting and implementing an optional action, and reevaluating and refining the action (or assign "Applying Problem-Solving to Interpersonal Conflict" in the Adult Psychotherapy Homework Planner by Stephannie Li). Objective Identify, challenge, and replace biased, fearful self-talk with positive, realistic, and empowering self-talk. Target Date: 2023-02-23 Frequency: biweekly  Progress: 0 Modality: individual  Related Interventions Explore the client's schema and self-talk that mediate his/her fear response; assist him/her in challenging the biases; replace the distorted messages with reality-based alternatives and positive, realistic self-talk that will increase his/her self-confidence in coping with irrational fears (see Cognitive Therapy of Anxiety Disorders by Laurence Slate). Assign the client a homework exercise in which he/she identifies fearful self-talk, identifies biases in the self-talk, generates alternatives, and tests through behavioral experiments (or assign "Negative Thoughts Trigger Negative Feelings" in the Adult Psychotherapy  Homework Planner by Stephannie Li); review and reinforce success, providing corrective feedback toward improvement. Objective Learn to accept limitations in life and commit to tolerating, rather than avoiding, unpleasant emotions while accomplishing meaningful goals. Target Date: 2023-02-23 Frequency: biweekly  Progress: 0 Modality: individual  Related Interventions Use techniques from Acceptance and Commitment Therapy to help client accept uncomfortable realities such as lack of complete control, imperfections, and uncertainty and tolerate unpleasant emotions and  thoughts in order to accomplish value-consistent goals. Objective Learn and implement personal and interpersonal skills to reduce anxiety and improve interpersonal relationships. Target Date: 2023-02-23 Frequency: biweekly  Progress: 0 Modality: individual  Related Interventions Use instruction, modeling, and role-playing to build the client's general social, communication, and/or conflict resolution skills. Objective Maintain involvement in work, family, and social activities. Target Date: 2023-02-23 Frequency: biweekly  Progress: 0 Modality: individual  Related Interventions Support the client in following through with work, family, and social activities rather than escaping or avoiding them to focus on anxiety. 15. Terminate overeating and implement lifestyle changes that lead to weight loss and improved health. 16. Terminate the pattern of binge eating and purging behavior with a return to eating normal amounts of nutritious foods.  Diagnosis:MDD (major depressive disorder), single episode, moderate  Anxiety  Plan:  -meet again on Thursday, Jul 07, 2022 at 3pm in-person

## 2022-07-07 ENCOUNTER — Ambulatory Visit: Payer: BC Managed Care – PPO | Admitting: Professional

## 2022-07-07 ENCOUNTER — Encounter: Payer: Self-pay | Admitting: Professional

## 2022-07-07 DIAGNOSIS — F321 Major depressive disorder, single episode, moderate: Secondary | ICD-10-CM | POA: Diagnosis not present

## 2022-07-07 DIAGNOSIS — F419 Anxiety disorder, unspecified: Secondary | ICD-10-CM | POA: Diagnosis not present

## 2022-07-07 NOTE — Progress Notes (Signed)
[facility] Counselor/Therapist Progress Note  Patient ID: Jerry Cisneros, MRN: [id number],    Date: 07/07/2022  Time Spent: 53 minutes [id number]  Treatment Type: Individual Therapy  Risk Assessment: Danger to Self:  No Self-injurious Behavior: No Danger to Others: No  Subjective: The patient arrived on time for his in-person session.   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, civil (consulting) -feels confused about his preference -doesn't know how to engage relationships 4-relationships -pt really scared of relationships -in HS was infatuated with every girl he was friends with -in the past year there are two people that he had a church on, a woman and a man -when he was feeling in love "I was doing a lot of irrational things" like staring at them, feeling emotional -since he felt like there was no relationship potential it was painful -he knows a lot of young people that don't have the money to move out  Treatment Plan Problems Addressed  Anxiety, Eating Disorders And Obesity, Family Conflict, Unipolar Depression Goals 1. Alleviate depressive symptoms and return to previous level of effective functioning. Objective Describe current and past experiences with depression including their impact on functioning and attempts to resolve it. Target Date: 2023-02-23 Frequency: biweekly  Progress: 0 Modality: individual  Related  Interventions Encourage the client to share his/her thoughts and feelings of depression; express empathy and build rapport while identifying primary cognitive, behavioral, interpersonal, or other contributors to depression. Assess current and past mood episodes including their features, frequency, severity, and duration (e.g., clinical interview supplemented by the Inventory to Diagnose Depression). Objective Identify and replace thoughts and beliefs that support depression. Target Date: 2023-02-23 Frequency: biweekly  Progress: 0 Modality: individual  Related Interventions Conduct Cognitive-Behavioral Therapy (see Cognitive Behavior Therapy by Reola Calkins; Overcoming Depression by Agapito Games al.), beginning with helping the client learn the connection among cognition, depressive feelings, and actions. Assign the client to self-monitor thoughts, feelings, and actions in daily journal (e.g., "Negative Thoughts Trigger Negative Feelings" in the Adult Psychotherapy Homework Planner by Stephannie Li; "Daily Record of Dysfunctional Thoughts" in Cognitive Therapy of Depression by Ashby Dawes and Shea Evans); process the journal material to challenge depressive thinking patterns and replace them with reality-based thoughts. Assign "behavioral experiments" in which depressive automatic thoughts are treated as hypotheses/prediction, reality-based alternative hypotheses/prediction are generated, and both are tested against the client's past, present, and/or future experiences. Facilitate and reinforce the client's shift from biased depressive self-talk and beliefs to reality-based cognitive messages that enhance self-confidence and increase adaptive actions (see "Positive Self-Talk" in the Adult Psychotherapy Homework Planner by Stephannie Li). Objective Learn and implement behavioral strategies to overcome depression. Target Date: 2023-02-23 Frequency: biweekly  Progress: 0 Modality: individual  Related Interventions Engage  the client in "behavioral activation," increasing his/her activity level and contact with sources of reward, while identifying processes that inhibit activation (see Behavioral Activation for Depression by Katharine Look, Dimidjian, and Herman-Dunn; or assign "Identify and Schedule  Pleasant Activities" in the Adult Psychotherapy Homework Planner by [facility]); use behavioral techniques such as instruction, rehearsal, role-playing, role reversal, as needed, to facilitate activity in the client's daily life; reinforce success. Assist the client in developing skills that increase the likelihood of deriving pleasure from behavioral activation (e.g., assertiveness skills, developing an exercise plan, less internal/more external focus, increased social involvement); reinforce success. Objective Verbalize an understanding and resolution of current interpersonal problems. Target Date: 2023-02-23 Frequency: biweekly  Progress: 0 Modality: individual  Related Interventions For role transitions (e.g., beginning or ending a relationship or career, moving, promotion, retirement, graduation), help the client mourn the loss of the old role while recognizing positive and negative aspects of the new role, and taking steps to gain mastery over the new role. Objective Learn and implement problem-solving and decision-making skills. Target Date: 2023-02-23 Frequency: biweekly  Progress: 0 Modality: individual  Objective Learn and implement conflict resolution skills to resolve interpersonal problems. Target Date: 2023-02-23 Frequency: biweekly  Progress: 0 Modality: individual  Related Interventions Teach conflict resolution skills (e.g., empathy, active listening, "I messages," respectful communication, assertiveness without aggression, compromise); use psychoeducation, modeling, role-playing, and rehearsal to work through several current conflicts; assign homework exercises; review and repeat so as to integrate their use into  the client's life. Objective Provide behavioral, emotional, and attitudinal information toward an assessment of specifiers relevant to a DSM diagnosis, the efficacy of treatment, and the nature of the therapy relationship. Target Date: 2023-02-23 Frequency: biweekly  Progress: 0 Modality: individual  2. Appropriately grieve the loss in order to normalize mood and to return to previously adaptive level of functioning. 3. Decrease the level of present conflict with parents while beginning to let go of or resolving past conflicts with them. Objective Describe the conflicts and the causes of conflicts between self and parents. Target Date: 2023-02-23 Frequency: biweekly  Progress: 0 Modality: individual  Related Interventions Give verbal permission for the client to have and express own feelings, thoughts, and perspectives in order to foster a sense of autonomy from family. Explore the nature of the client's family conflicts and their perceived causes. Objective Identify own as well as others' role in the family conflicts. Target Date: 2023-02-23 Frequency: biweekly  Progress: 0 Modality: individual  Related Interventions Confront the client when he/she is not taking responsibility for his/her role in the family conflict and reinforce the client for owning responsibility for his/her contribution to the conflict. Objective Increase the level of independent functioning. Target Date: 2023-02-23 Frequency: biweekly  Progress: 0 Modality: individual  Related Interventions Confront the client's emotional dependence and avoidance of economic responsibility that promotes continuing pattern of living with parents; develop a plan for the client's healthy and responsible emancipation from parents that is, if possible, complete with their blessing (e.g., finding and keeping a job, saving money, socializing with friends, finding own housing, etc.). Objective Report an increase in resolving conflicts with  parents by talking calmly and assertively rather than aggressively and defensively. Target Date: 2023-02-23 Frequency: biweekly  Progress: 0 Modality: individual  Related Interventions Use role-playing, role reversal, modeling, and behavioral rehearsal to help the client develop assertive ways to resolve conflict with parents (recommend Your Perfect Right: Assertiveness and Equality in Your Life and Relationships by Carnella Guadalajara). 4. Develop coping strategies (e.g., feeling identification, problem-solving, assertiveness) to address emotional issues that could lead to relapse of the eating disorder. 5. Develop healthy cognitive patterns and beliefs about self that lead to positive identity and prevent a relapse of the  eating disorder. 6. Develop healthy interpersonal relationships that lead to alleviation and help prevent the relapse of the eating disorder. 7. Develop healthy interpersonal relationships that lead to the alleviation and help prevent the relapse of depression. 8. Develop healthy thinking patterns and beliefs about self, others, and the world that lead to the alleviation and help prevent the relapse of depression. 9. Enhance ability to effectively cope with the full variety of life's worries and anxieties. 10. Learn and implement coping skills that result in a reduction of anxiety and worry, and improved daily functioning. 11. Reach a level of reduced tension, increased satisfaction, and improved communication with family and/or other authority figures. 12. Recognize, accept, and cope with feelings of depression. 13. Restore normal eating patterns, healthy weight maintenance, and a realistic appraisal of body size. Objective Honestly describe the pattern of eating including types, amounts, and frequency of food consumed or hoarded. Target Date: 2023-02-23 Frequency: biweekly  Progress: 0 Modality: individual  Related Interventions Assess the historical course of the disorder  including the amount, type, and pattern of the client's food intake (e.g., too little food, too much food, binge eating, or hoarding food); perceived personal and interpersonal triggers and personal goals. Objective Establish regular eating patterns by eating at regular intervals and consuming optimal daily calories. Target Date: 2023-02-23 Frequency: biweekly  Progress: 0 Modality: individual  Related Interventions Establish healthy weight goals for the client per the Body Mass Index (BMI), the [employer] Height and Weight Tables, or some other recognized standard. Objective Learn and implement skills for managing urges to engage in unhealthy eating or weight loss practices. Target Date: 2023-02-23 Frequency: biweekly  Progress: 0 Modality: individual  Related Interventions Teach the client tailored skills to manage high-risk situations including distraction, positive self-talk, problem-solving, conflict resolution (e.g., empathy, active listening, "I messages," respectful communication, assertiveness without aggression, compromise), or other social/ communication skills; use modeling, role-playing, and behavior rehearsal to work through several current situations. Objective State a basis for positive identity that is not based on weight and appearance but on character, traits, relationships, and intrinsic value. Target Date: 2023-02-23 Frequency: biweekly  Progress: 0 Modality: individual  Related Interventions Assist the client in identifying a basis for self-worth apart from body image by reviewing his/her talents, successes, positive traits, importance to others, and intrinsic spiritual value. Objective Verbalize an understanding of relapse prevention and the distinction between a lapse and a relapse. Target Date: 2023-02-23 Frequency: biweekly  Progress: 0 Modality: individual  Related Interventions Discuss with the client the distinction between a lapse and relapse, associating a  lapse with an initial and reversible return of distress, urges, or to avoid, and relapse with the decision to return to the cycle of maladaptive thoughts and actions (e.g., feeling anxious, binging, then purging). Identify with the client future situations or circumstances in which lapses could occur. 14. Stabilize anxiety level while increasing ability to function on a daily basis. Objective Learn and implement problem-solving strategies for realistically addressing worries. Target Date: 2023-02-23 Frequency: biweekly  Progress: 0 Modality: individual  Related Interventions Teach the client problem-solving strategies involving specifically defining a problem, generating options for addressing it, evaluating the pros and cons of each option, selecting and implementing an optional action, and reevaluating and refining the action (or assign "Applying Problem-Solving to Interpersonal Conflict" in the Adult Psychotherapy Homework Planner by Stephannie Li). Objective Identify, challenge, and replace biased, fearful self-talk with positive, realistic, and empowering self-talk. Target Date: 2023-02-23 Frequency: biweekly  Progress: 0 Modality: individual  Related Interventions  Explore the client's schema and self-talk that mediate his/her fear response; assist him/her in challenging the biases; replace the distorted messages with reality-based alternatives and positive, realistic self-talk that will increase his/her self-confidence in coping with irrational fears (see Cognitive Therapy of Anxiety Disorders by Laurence Slate). Assign the client a homework exercise in which he/she identifies fearful self-talk, identifies biases in the self-talk, generates alternatives, and tests through behavioral experiments (or assign "Negative Thoughts Trigger Negative Feelings" in the Adult Psychotherapy Homework Planner by [facility]); review and reinforce success, providing corrective feedback toward  improvement. Objective Learn to accept limitations in life and commit to tolerating, rather than avoiding, unpleasant emotions while accomplishing meaningful goals. Target Date: 2023-02-23 Frequency: biweekly  Progress: 0 Modality: individual  Related Interventions Use techniques from Acceptance and Commitment Therapy to help client accept uncomfortable realities such as lack of complete control, imperfections, and uncertainty and tolerate unpleasant emotions and thoughts in order to accomplish value-consistent goals. Objective Learn and implement personal and interpersonal skills to reduce anxiety and improve interpersonal relationships. Target Date: 2023-02-23 Frequency: biweekly  Progress: 0 Modality: individual  Related Interventions Use instruction, modeling, and role-playing to build the client's general social, communication, and/or conflict resolution skills. Objective Maintain involvement in work, family, and social activities. Target Date: 2023-02-23 Frequency: biweekly  Progress: 0 Modality: individual  Related Interventions Support the client in following through with work, family, and social activities rather than escaping or avoiding them to focus on anxiety. 15. Terminate overeating and implement lifestyle changes that lead to weight loss and improved health. 16. Terminate the pattern of binge eating and purging behavior with a return to eating normal amounts of nutritious foods.  Diagnosis:MDD (major depressive disorder), single episode, moderate (HCC)  Anxiety  Plan:  -meet again on Thursday, Jul 21, 2022 at 3pm in-person

## 2022-07-21 ENCOUNTER — Ambulatory Visit: Payer: BC Managed Care – PPO | Admitting: Professional

## 2022-07-21 ENCOUNTER — Encounter: Payer: Self-pay | Admitting: Professional

## 2022-07-21 DIAGNOSIS — F419 Anxiety disorder, unspecified: Secondary | ICD-10-CM

## 2022-07-21 DIAGNOSIS — F321 Major depressive disorder, single episode, moderate: Secondary | ICD-10-CM | POA: Diagnosis not present

## 2022-07-21 NOTE — Progress Notes (Signed)
[facility] Counselor/Therapist Progress Note  Patient ID: Jerry Cisneros, MRN: [id number],    Date: 07/21/2022  Time Spent: 48 minutes 3-348pm  Treatment Type: Individual Therapy  Risk Assessment: Danger to Self:  No Self-injurious Behavior: No Danger to Others: No  Subjective: The patient arrived on time for his in-person session.   Issues addressed: 1-feels sad today since he had his beard shaved but it did not turn out as he had hoped a-a coworker commented that he looked like someone that would seek out little children and this offended him -discussed reality of coworker believing this or of him joking and pt admits he thinks he was joking -pt doesn't like his full face exposed because it lets him see how round his face is and reminds him that he has gained weight 2-self-control -discussed pt's desire to be fit and healthy -pt admits that he has been eating out a lot and that this has contributed to weight gain -admits he is trying to make better choices and got some fruit, carrots, and a parfait from [facility] for his lunch -discussed potential for nutrition support and patient is interested and was provided information for [facility] -pt admits he struggles with commitment to change 3-living with parents -feels stiffeled -doesn't agree with parents beliefs and doesn't want to follow -how to have discussions with parents about his needs/wants being different -how to help parents see him as an adult and not as their child-child -support from brother who believes similarly to pt  Treatment Plan Problems Addressed  Anxiety, Eating Disorders And Obesity, Family Conflict, Unipolar Depression Goals 1. Alleviate depressive symptoms and return to previous level of effective functioning. Objective Describe current and past experiences with depression including their impact on functioning and attempts to resolve it. Target Date:  2023-02-23 Frequency: biweekly  Progress: 0 Modality: individual  Related Interventions Encourage the client to share his/her thoughts and feelings of depression; express empathy and build rapport while identifying primary cognitive, behavioral, interpersonal, or other contributors to depression. Assess current and past mood episodes including their features, frequency, severity, and duration (e.g., clinical interview supplemented by the Inventory to Diagnose Depression). Objective Identify and replace thoughts and beliefs that support depression. Target Date: 2023-02-23 Frequency: biweekly  Progress: 0 Modality: individual  Related Interventions Conduct Cognitive-Behavioral Therapy (see Cognitive Behavior Therapy by Reola Calkins; Overcoming Depression by Agapito Games al.), beginning with helping the client learn the connection among cognition, depressive feelings, and actions. Assign the client to self-monitor thoughts, feelings, and actions in daily journal (e.g., "Negative Thoughts Trigger Negative Feelings" in the Adult Psychotherapy Homework Planner by Stephannie Li; "Daily Record of Dysfunctional Thoughts" in Cognitive Therapy of Depression by Ashby Dawes and Shea Evans); process the journal material to challenge depressive thinking patterns and replace them with reality-based thoughts. Assign "behavioral experiments" in which depressive automatic thoughts are treated as hypotheses/prediction, reality-based alternative hypotheses/prediction are generated, and both are tested against the client's past, present, and/or future experiences. Facilitate and reinforce the client's shift from biased depressive self-talk and beliefs to reality-based cognitive messages that enhance self-confidence and increase adaptive actions (see "Positive Self-Talk" in the Adult Psychotherapy Homework Planner by Stephannie Li). Objective Learn and implement behavioral strategies to overcome depression. Target Date: 2023-02-23 Frequency:  biweekly  Progress: 0 Modality: individual  Related Interventions Engage the client in "behavioral activation," increasing his/her activity level and contact with sources of reward, while identifying processes that inhibit activation (see Behavioral Activation for Depression by Katharine Look, Dimidjian, and Herman-Dunn;  or assign "Identify and Schedule Pleasant Activities" in the Adult Psychotherapy Homework Planner by [facility]); use behavioral techniques such as instruction, rehearsal, role-playing, role reversal, as needed, to facilitate activity in the client's daily life; reinforce success. Assist the client in developing skills that increase the likelihood of deriving pleasure from behavioral activation (e.g., assertiveness skills, developing an exercise plan, less internal/more external focus, increased social involvement); reinforce success. Objective Verbalize an understanding and resolution of current interpersonal problems. Target Date: 2023-02-23 Frequency: biweekly  Progress: 0 Modality: individual  Related Interventions For role transitions (e.g., beginning or ending a relationship or career, moving, promotion, retirement, graduation), help the client mourn the loss of the old role while recognizing positive and negative aspects of the new role, and taking steps to gain mastery over the new role. Objective Learn and implement problem-solving and decision-making skills. Target Date: 2023-02-23 Frequency: biweekly  Progress: 0 Modality: individual  Objective Learn and implement conflict resolution skills to resolve interpersonal problems. Target Date: 2023-02-23 Frequency: biweekly  Progress: 0 Modality: individual  Related Interventions Teach conflict resolution skills (e.g., empathy, active listening, "I messages," respectful communication, assertiveness without aggression, compromise); use psychoeducation, modeling, role-playing, and rehearsal to work through several current conflicts;  assign homework exercises; review and repeat so as to integrate their use into the client's life. Objective Provide behavioral, emotional, and attitudinal information toward an assessment of specifiers relevant to a DSM diagnosis, the efficacy of treatment, and the nature of the therapy relationship. Target Date: 2023-02-23 Frequency: biweekly  Progress: 0 Modality: individual  2. Appropriately grieve the loss in order to normalize mood and to return to previously adaptive level of functioning. 3. Decrease the level of present conflict with parents while beginning to let go of or resolving past conflicts with them. Objective Describe the conflicts and the causes of conflicts between self and parents. Target Date: 2023-02-23 Frequency: biweekly  Progress: 0 Modality: individual  Related Interventions Give verbal permission for the client to have and express own feelings, thoughts, and perspectives in order to foster a sense of autonomy from family. Explore the nature of the client's family conflicts and their perceived causes. Objective Identify own as well as others' role in the family conflicts. Target Date: 2023-02-23 Frequency: biweekly  Progress: 0 Modality: individual  Related Interventions Confront the client when he/she is not taking responsibility for his/her role in the family conflict and reinforce the client for owning responsibility for his/her contribution to the conflict. Objective Increase the level of independent functioning. Target Date: 2023-02-23 Frequency: biweekly  Progress: 0 Modality: individual  Related Interventions Confront the client's emotional dependence and avoidance of economic responsibility that promotes continuing pattern of living with parents; develop a plan for the client's healthy and responsible emancipation from parents that is, if possible, complete with their blessing (e.g., finding and keeping a job, saving money, socializing with friends, finding  own housing, etc.). Objective Report an increase in resolving conflicts with parents by talking calmly and assertively rather than aggressively and defensively. Target Date: 2023-02-23 Frequency: biweekly  Progress: 0 Modality: individual  Related Interventions Use role-playing, role reversal, modeling, and behavioral rehearsal to help the client develop assertive ways to resolve conflict with parents (recommend Your Perfect Right: Assertiveness and Equality in Your Life and Relationships by Carnella Guadalajara). 4. Develop coping strategies (e.g., feeling identification, problem-solving, assertiveness) to address emotional issues that could lead to relapse of the eating disorder. 5. Develop healthy cognitive patterns and beliefs about self that lead to positive identity and  prevent a relapse of the eating disorder. 6. Develop healthy interpersonal relationships that lead to alleviation and help prevent the relapse of the eating disorder. 7. Develop healthy interpersonal relationships that lead to the alleviation and help prevent the relapse of depression. 8. Develop healthy thinking patterns and beliefs about self, others, and the world that lead to the alleviation and help prevent the relapse of depression. 9. Enhance ability to effectively cope with the full variety of life's worries and anxieties. 10. Learn and implement coping skills that result in a reduction of anxiety and worry, and improved daily functioning. 11. Reach a level of reduced tension, increased satisfaction, and improved communication with family and/or other authority figures. 12. Recognize, accept, and cope with feelings of depression. 13. Restore normal eating patterns, healthy weight maintenance, and a realistic appraisal of body size. Objective Honestly describe the pattern of eating including types, amounts, and frequency of food consumed or hoarded. Target Date: 2023-02-23 Frequency: biweekly  Progress: 0 Modality:  individual  Related Interventions Assess the historical course of the disorder including the amount, type, and pattern of the client's food intake (e.g., too little food, too much food, binge eating, or hoarding food); perceived personal and interpersonal triggers and personal goals. Objective Establish regular eating patterns by eating at regular intervals and consuming optimal daily calories. Target Date: 2023-02-23 Frequency: biweekly  Progress: 0 Modality: individual  Related Interventions Establish healthy weight goals for the client per the Body Mass Index (BMI), the [place] Height and Weight Tables, or some other recognized standard. Objective Learn and implement skills for managing urges to engage in unhealthy eating or weight loss practices. Target Date: 2023-02-23 Frequency: biweekly  Progress: 0 Modality: individual  Related Interventions Teach the client tailored skills to manage high-risk situations including distraction, positive self-talk, problem-solving, conflict resolution (e.g., empathy, active listening, "I messages," respectful communication, assertiveness without aggression, compromise), or other social/ communication skills; use modeling, role-playing, and behavior rehearsal to work through several current situations. Objective State a basis for positive identity that is not based on weight and appearance but on character, traits, relationships, and intrinsic value. Target Date: 2023-02-23 Frequency: biweekly  Progress: 0 Modality: individual  Related Interventions Assist the client in identifying a basis for self-worth apart from body image by reviewing his/her talents, successes, positive traits, importance to others, and intrinsic spiritual value. Objective Verbalize an understanding of relapse prevention and the distinction between a lapse and a relapse. Target Date: 2023-02-23 Frequency: biweekly  Progress: 0 Modality: individual  Related  Interventions Discuss with the client the distinction between a lapse and relapse, associating a lapse with an initial and reversible return of distress, urges, or to avoid, and relapse with the decision to return to the cycle of maladaptive thoughts and actions (e.g., feeling anxious, binging, then purging). Identify with the client future situations or circumstances in which lapses could occur. 14. Stabilize anxiety level while increasing ability to function on a daily basis. Objective Learn and implement problem-solving strategies for realistically addressing worries. Target Date: 2023-02-23 Frequency: biweekly  Progress: 0 Modality: individual  Related Interventions Teach the client problem-solving strategies involving specifically defining a problem, generating options for addressing it, evaluating the pros and cons of each option, selecting and implementing an optional action, and reevaluating and refining the action (or assign "Applying Problem-Solving to Interpersonal Conflict" in the Adult Psychotherapy Homework Planner by Stephannie Li). Objective Identify, challenge, and replace biased, fearful self-talk with positive, realistic, and empowering self-talk. Target Date: 2023-02-23 Frequency: biweekly  Progress: 0  Modality: individual  Related Interventions Explore the client's schema and self-talk that mediate his/her fear response; assist him/her in challenging the biases; replace the distorted messages with reality-based alternatives and positive, realistic self-talk that will increase his/her self-confidence in coping with irrational fears (see Cognitive Therapy of Anxiety Disorders by Laurence Slate). Assign the client a homework exercise in which he/she identifies fearful self-talk, identifies biases in the self-talk, generates alternatives, and tests through behavioral experiments (or assign "Negative Thoughts Trigger Negative Feelings" in the Adult Psychotherapy Homework Planner by  [facility]); review and reinforce success, providing corrective feedback toward improvement. Objective Learn to accept limitations in life and commit to tolerating, rather than avoiding, unpleasant emotions while accomplishing meaningful goals. Target Date: 2023-02-23 Frequency: biweekly  Progress: 0 Modality: individual  Related Interventions Use techniques from Acceptance and Commitment Therapy to help client accept uncomfortable realities such as lack of complete control, imperfections, and uncertainty and tolerate unpleasant emotions and thoughts in order to accomplish value-consistent goals. Objective Learn and implement personal and interpersonal skills to reduce anxiety and improve interpersonal relationships. Target Date: 2023-02-23 Frequency: biweekly  Progress: 0 Modality: individual  Related Interventions Use instruction, modeling, and role-playing to build the client's general social, communication, and/or conflict resolution skills. Objective Maintain involvement in work, family, and social activities. Target Date: 2023-02-23 Frequency: biweekly  Progress: 0 Modality: individual  Related Interventions Support the client in following through with work, family, and social activities rather than escaping or avoiding them to focus on anxiety. 15. Terminate overeating and implement lifestyle changes that lead to weight loss and improved health. 16. Terminate the pattern of binge eating and purging behavior with a return to eating normal amounts of nutritious foods.  Diagnosis:MDD (major depressive disorder), single episode, moderate (HCC)  Anxiety  Plan:  -meet again on Thursday, Aug 04, 2022 at 3pm in-person

## 2022-08-04 ENCOUNTER — Telehealth: Payer: Self-pay | Admitting: Family Medicine

## 2022-08-04 ENCOUNTER — Ambulatory Visit: Payer: BC Managed Care – PPO | Admitting: Professional

## 2022-08-04 ENCOUNTER — Encounter: Payer: Self-pay | Admitting: Professional

## 2022-08-04 DIAGNOSIS — F321 Major depressive disorder, single episode, moderate: Secondary | ICD-10-CM

## 2022-08-04 DIAGNOSIS — F429 Obsessive-compulsive disorder, unspecified: Secondary | ICD-10-CM

## 2022-08-04 DIAGNOSIS — G479 Sleep disorder, unspecified: Secondary | ICD-10-CM

## 2022-08-04 DIAGNOSIS — F419 Anxiety disorder, unspecified: Secondary | ICD-10-CM | POA: Diagnosis not present

## 2022-08-04 NOTE — Progress Notes (Signed)
[facility] Counselor/Therapist Progress Note  Patient ID: Varun Fowlks, MRN: [id number],    Date: 08/04/2022  Time Spent: 62 minutes 3-402pm  Treatment Type: Individual Therapy  Risk Assessment: Danger to Self:  No Self-injurious Behavior: No Danger to Others: No  Subjective: The patient arrived on time for his in-person session.   Issues addressed: 1-how to journal -no right or wrong -educated -examples 2-intrusive thoughts -he has journaled on discord online but doesn't know the exact details -patient admits to intrusive thoughts often about wanting to kill (started within past two months and eat people (before therapy began but not more than a year) (fleeting thoughts), suicide (not super regularly but has occurred for about two years) about three times per week, and those that doesn't annoy him as much (unfunny jokes) -he has no specific people in mind and doesn't have any specific plans -pt thinks intrusive thoughts do not distract him from doing anything -if feeling really depressed he has thoughts of wanting to die -the intrusive thoughts don't stop me from doing my job -intrusive thoughts occur when he doesn't feel in control and it could make him feel more powerful -pt reports his father has anxiety and his mother feels depressed -his paternal great uncle had a mental illness and spent time inpatient -pt struggles with feeling motivated despite having goals he wishes to achieve 3-mood    08/04/2022    3:44 PM 02/10/2022    2:21 PM 12/21/2021    4:48 PM  Depression screen PHQ 2/9  Decreased Interest 1 0 1  Down, Depressed, Hopeless 2 1 1   PHQ - 2 Score 3 1 2   Altered sleeping 3 1 3   Tired, decreased energy 3 3 0  Change in appetite 3 3 0  Feeling bad or failure about yourself  2 2 0  Trouble concentrating 1 0 0  Moving slowly or fidgety/restless 1 3 0  Suicidal thoughts 1 1 0  PHQ-9 Score 17 14 5   Difficult doing work/chores Very difficult  Very difficult Not difficult at all     4-referral for psychiatry -discussed referral for psychiatric evaluation and pt is willing -he admits he doesn't like the intrusive thoughts and would like to get rid of them -he is experiencing a moderately severe depression at this time -consulted with Dr. Linford Arnold, pt's PCP and shared my concern for pt and desire for psychiatry referral -pt is not imminently or even urgently a danger to himself or anyone else and therefore referral for evaluation is appropriate  Treatment Plan Problems Addressed  Anxiety, Eating Disorders And Obesity, Family Conflict, Unipolar Depression Goals 1. Alleviate depressive symptoms and return to previous level of effective functioning. Objective Describe current and past experiences with depression including their impact on functioning and attempts to resolve it. Target Date: 2023-02-23 Frequency: biweekly  Progress: 0 Modality: individual  Related Interventions Encourage the client to share his/her thoughts and feelings of depression; express empathy and build rapport while identifying primary cognitive, behavioral, interpersonal, or other contributors to depression. Assess current and past mood episodes including their features, frequency, severity, and duration (e.g., clinical interview supplemented by the Inventory to Diagnose Depression). Objective Identify and replace thoughts and beliefs that support depression. Target Date: 2023-02-23 Frequency: biweekly  Progress: 0 Modality: individual  Related Interventions Conduct Cognitive-Behavioral Therapy (see Cognitive Behavior Therapy by Reola Calkins; Overcoming Depression by Agapito Games al.), beginning with helping the client learn the connection among cognition, depressive feelings, and actions. Assign the client to self-monitor thoughts, feelings,  and actions in daily journal (e.g., "Negative Thoughts Trigger Negative Feelings" in the Adult Psychotherapy Homework Planner  by Stephannie Li; "Daily Record of Dysfunctional Thoughts" in Cognitive Therapy of Depression by Ashby Dawes and Shea Evans); process the journal material to challenge depressive thinking patterns and replace them with reality-based thoughts. Assign "behavioral experiments" in which depressive automatic thoughts are treated as hypotheses/prediction, reality-based alternative hypotheses/prediction are generated, and both are tested against the client's past, present, and/or future experiences. Facilitate and reinforce the client's shift from biased depressive self-talk and beliefs to reality-based cognitive messages that enhance self-confidence and increase adaptive actions (see "Positive Self-Talk" in the Adult Psychotherapy Homework Planner by Stephannie Li). Objective Learn and implement behavioral strategies to overcome depression. Target Date: 2023-02-23 Frequency: biweekly  Progress: 0 Modality: individual  Related Interventions Engage the client in "behavioral activation," increasing his/her activity level and contact with sources of reward, while identifying processes that inhibit activation (see Behavioral Activation for Depression by Katharine Look, Dimidjian, and Herman-Dunn; or assign "Identify and Schedule Pleasant Activities" in the Adult Psychotherapy Homework Planner by [facility]); use behavioral techniques such as instruction, rehearsal, role-playing, role reversal, as needed, to facilitate activity in the client's daily life; reinforce success. Assist the client in developing skills that increase the likelihood of deriving pleasure from behavioral activation (e.g., assertiveness skills, developing an exercise plan, less internal/more external focus, increased social involvement); reinforce success. Objective Verbalize an understanding and resolution of current interpersonal problems. Target Date: 2023-02-23 Frequency: biweekly  Progress: 0 Modality: individual  Related Interventions For role transitions  (e.g., beginning or ending a relationship or career, moving, promotion, retirement, graduation), help the client mourn the loss of the old role while recognizing positive and negative aspects of the new role, and taking steps to gain mastery over the new role. Objective Learn and implement problem-solving and decision-making skills. Target Date: 2023-02-23 Frequency: biweekly  Progress: 0 Modality: individual  Objective Learn and implement conflict resolution skills to resolve interpersonal problems. Target Date: 2023-02-23 Frequency: biweekly  Progress: 0 Modality: individual  Related Interventions Teach conflict resolution skills (e.g., empathy, active listening, "I messages," respectful communication, assertiveness without aggression, compromise); use psychoeducation, modeling, role-playing, and rehearsal to work through several current conflicts; assign homework exercises; review and repeat so as to integrate their use into the client's life. Objective Provide behavioral, emotional, and attitudinal information toward an assessment of specifiers relevant to a DSM diagnosis, the efficacy of treatment, and the nature of the therapy relationship. Target Date: 2023-02-23 Frequency: biweekly  Progress: 0 Modality: individual  2. Appropriately grieve the loss in order to normalize mood and to return to previously adaptive level of functioning. 3. Decrease the level of present conflict with parents while beginning to let go of or resolving past conflicts with them. Objective Describe the conflicts and the causes of conflicts between self and parents. Target Date: 2023-02-23 Frequency: biweekly  Progress: 0 Modality: individual  Related Interventions Give verbal permission for the client to have and express own feelings, thoughts, and perspectives in order to foster a sense of autonomy from family. Explore the nature of the client's family conflicts and their perceived causes. Objective Identify  own as well as others' role in the family conflicts. Target Date: 2023-02-23 Frequency: biweekly  Progress: 0 Modality: individual  Related Interventions Confront the client when he/she is not taking responsibility for his/her role in the family conflict and reinforce the client for owning responsibility for his/her contribution to the conflict. Objective Increase the level of independent functioning. Target Date: 2023-02-23  Frequency: biweekly  Progress: 0 Modality: individual  Related Interventions Confront the client's emotional dependence and avoidance of economic responsibility that promotes continuing pattern of living with parents; develop a plan for the client's healthy and responsible emancipation from parents that is, if possible, complete with their blessing (e.g., finding and keeping a job, saving money, socializing with friends, finding own housing, etc.). Objective Report an increase in resolving conflicts with parents by talking calmly and assertively rather than aggressively and defensively. Target Date: 2023-02-23 Frequency: biweekly  Progress: 0 Modality: individual  Related Interventions Use role-playing, role reversal, modeling, and behavioral rehearsal to help the client develop assertive ways to resolve conflict with parents (recommend Your Perfect Right: Assertiveness and Equality in Your Life and Relationships by Carnella Guadalajara). 4. Develop coping strategies (e.g., feeling identification, problem-solving, assertiveness) to address emotional issues that could lead to relapse of the eating disorder. 5. Develop healthy cognitive patterns and beliefs about self that lead to positive identity and prevent a relapse of the eating disorder. 6. Develop healthy interpersonal relationships that lead to alleviation and help prevent the relapse of the eating disorder. 7. Develop healthy interpersonal relationships that lead to the alleviation and help prevent the relapse of  depression. 8. Develop healthy thinking patterns and beliefs about self, others, and the world that lead to the alleviation and help prevent the relapse of depression. 9. Enhance ability to effectively cope with the full variety of life's worries and anxieties. 10. Learn and implement coping skills that result in a reduction of anxiety and worry, and improved daily functioning. 11. Reach a level of reduced tension, increased satisfaction, and improved communication with family and/or other authority figures. 12. Recognize, accept, and cope with feelings of depression. 13. Restore normal eating patterns, healthy weight maintenance, and a realistic appraisal of body size. Objective Honestly describe the pattern of eating including types, amounts, and frequency of food consumed or hoarded. Target Date: 2023-02-23 Frequency: biweekly  Progress: 0 Modality: individual  Related Interventions Assess the historical course of the disorder including the amount, type, and pattern of the client's food intake (e.g., too little food, too much food, binge eating, or hoarding food); perceived personal and interpersonal triggers and personal goals. Objective Establish regular eating patterns by eating at regular intervals and consuming optimal daily calories. Target Date: 2023-02-23 Frequency: biweekly  Progress: 0 Modality: individual  Related Interventions Establish healthy weight goals for the client per the Body Mass Index (BMI), the [employer] Height and Weight Tables, or some other recognized standard. Objective Learn and implement skills for managing urges to engage in unhealthy eating or weight loss practices. Target Date: 2023-02-23 Frequency: biweekly  Progress: 0 Modality: individual  Related Interventions Teach the client tailored skills to manage high-risk situations including distraction, positive self-talk, problem-solving, conflict resolution (e.g., empathy, active listening, "I messages,"  respectful communication, assertiveness without aggression, compromise), or other social/ communication skills; use modeling, role-playing, and behavior rehearsal to work through several current situations. Objective State a basis for positive identity that is not based on weight and appearance but on character, traits, relationships, and intrinsic value. Target Date: 2023-02-23 Frequency: biweekly  Progress: 0 Modality: individual  Related Interventions Assist the client in identifying a basis for self-worth apart from body image by reviewing his/her talents, successes, positive traits, importance to others, and intrinsic spiritual value. Objective Verbalize an understanding of relapse prevention and the distinction between a lapse and a relapse. Target Date: 2023-02-23 Frequency: biweekly  Progress: 0 Modality: individual  Related Interventions  Discuss with the client the distinction between a lapse and relapse, associating a lapse with an initial and reversible return of distress, urges, or to avoid, and relapse with the decision to return to the cycle of maladaptive thoughts and actions (e.g., feeling anxious, binging, then purging). Identify with the client future situations or circumstances in which lapses could occur. 14. Stabilize anxiety level while increasing ability to function on a daily basis. Objective Learn and implement problem-solving strategies for realistically addressing worries. Target Date: 2023-02-23 Frequency: biweekly  Progress: 0 Modality: individual  Related Interventions Teach the client problem-solving strategies involving specifically defining a problem, generating options for addressing it, evaluating the pros and cons of each option, selecting and implementing an optional action, and reevaluating and refining the action (or assign "Applying Problem-Solving to Interpersonal Conflict" in the Adult Psychotherapy Homework Planner by Stephannie Li). Objective Identify,  challenge, and replace biased, fearful self-talk with positive, realistic, and empowering self-talk. Target Date: 2023-02-23 Frequency: biweekly  Progress: 0 Modality: individual  Related Interventions Explore the client's schema and self-talk that mediate his/her fear response; assist him/her in challenging the biases; replace the distorted messages with reality-based alternatives and positive, realistic self-talk that will increase his/her self-confidence in coping with irrational fears (see Cognitive Therapy of Anxiety Disorders by Laurence Slate). Assign the client a homework exercise in which he/she identifies fearful self-talk, identifies biases in the self-talk, generates alternatives, and tests through behavioral experiments (or assign "Negative Thoughts Trigger Negative Feelings" in the Adult Psychotherapy Homework Planner by [facility]); review and reinforce success, providing corrective feedback toward improvement. Objective Learn to accept limitations in life and commit to tolerating, rather than avoiding, unpleasant emotions while accomplishing meaningful goals. Target Date: 2023-02-23 Frequency: biweekly  Progress: 0 Modality: individual  Related Interventions Use techniques from Acceptance and Commitment Therapy to help client accept uncomfortable realities such as lack of complete control, imperfections, and uncertainty and tolerate unpleasant emotions and thoughts in order to accomplish value-consistent goals. Objective Learn and implement personal and interpersonal skills to reduce anxiety and improve interpersonal relationships. Target Date: 2023-02-23 Frequency: biweekly  Progress: 0 Modality: individual  Related Interventions Use instruction, modeling, and role-playing to build the client's general social, communication, and/or conflict resolution skills. Objective Maintain involvement in work, family, and social activities. Target Date: 2023-02-23 Frequency: biweekly   Progress: 0 Modality: individual  Related Interventions Support the client in following through with work, family, and social activities rather than escaping or avoiding them to focus on anxiety. 15. Terminate overeating and implement lifestyle changes that lead to weight loss and improved health. 16. Terminate the pattern of binge eating and purging behavior with a return to eating normal amounts of nutritious foods.  Diagnosis:MDD (major depressive disorder), single episode, moderate (HCC)  Anxiety  Obsessive-compulsive disorder, unspecified type (harm) rule in  Plan:  -Dr. Linford Arnold made referral for patient to be seen by Dr. Fredda Hammed at [facility] in [place] and patient is in agreement with referral -meet again on Thursday, August 18, 2022 at 3pm in-person

## 2022-08-04 NOTE — Telephone Encounter (Signed)
Orders Placed This Encounter  Procedures   Ambulatory referral to Psychiatry    Referral Priority:   Routine    Referral Type:   Psychiatric    Referral Reason:   Specialty Services Required    Requested Specialty:   Psychiatry    Number of Visits Requested:   1    

## 2022-08-17 ENCOUNTER — Telehealth: Payer: Self-pay

## 2022-08-17 NOTE — Telephone Encounter (Signed)
LVM for patient to call back [phone], or to call PCP office to schedule follow up apt. AS, CMA  

## 2022-08-18 ENCOUNTER — Ambulatory Visit: Payer: BC Managed Care – PPO | Admitting: Professional

## 2022-08-18 ENCOUNTER — Encounter: Payer: Self-pay | Admitting: Professional

## 2022-08-18 DIAGNOSIS — F429 Obsessive-compulsive disorder, unspecified: Secondary | ICD-10-CM | POA: Diagnosis not present

## 2022-08-18 DIAGNOSIS — F419 Anxiety disorder, unspecified: Secondary | ICD-10-CM

## 2022-08-18 DIAGNOSIS — F321 Major depressive disorder, single episode, moderate: Secondary | ICD-10-CM | POA: Diagnosis not present

## 2022-08-18 NOTE — Progress Notes (Signed)
[facility] Counselor/Therapist Progress Note  Patient ID: Jerry Cisneros, MRN: [id number],    Date: 08/18/2022  Time Spent: 48 minutes [id number]  Treatment Type: Individual Therapy  Risk Assessment: Danger to Self:  No Self-injurious Behavior: No Danger to Others: No  Subjective: The patient arrived on time for his in-person session.   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 while boss is on vacation -is doing a good job and has had only positive remarks -continues to feel conflicted related to desire to return to school and study cyber security -pt still in contemplation regarding commitment to school -looking at desire to make enough money to afford to live on his own 2-mental health -pt disclosed a preoccupation with Vorarephilia "vore" -he denies that he has ever acted on anything but has been fantasizing about since he was an adolescent -he also admits to rape fantasies but denies that he would ever act on them -discussed referral for psychiatry evaluation; he reports it is in July -pt reminded of need to share his sexual thoughts/fantasies with MD  Treatment Plan Problems Addressed  Anxiety, Eating Disorders And Obesity, Family Conflict, Unipolar Depression Goals 1. Alleviate depressive symptoms and return to previous level of effective functioning. Objective Describe current and past experiences with depression including their impact on functioning and attempts to resolve it. Target Date: 2023-02-23 Frequency: biweekly  Progress: 0 Modality: individual  Related Interventions Encourage the client to share his/her thoughts and feelings of depression; express empathy and build rapport while identifying primary cognitive, behavioral, interpersonal, or other contributors to depression. Assess current and past mood episodes including their features, frequency, severity, and duration (e.g.,  clinical interview supplemented by the Inventory to Diagnose Depression). Objective Identify and replace thoughts and beliefs that support depression. Target Date: 2023-02-23 Frequency: biweekly  Progress: 0 Modality: individual  Related Interventions Conduct Cognitive-Behavioral Therapy (see Cognitive Behavior Therapy by Reola Calkins; Overcoming Depression by Agapito Games al.), beginning with helping the client learn the connection among cognition, depressive feelings, and actions. Assign the client to self-monitor thoughts, feelings, and actions in daily journal (e.g., "Negative Thoughts Trigger Negative Feelings" in the Adult Psychotherapy Homework Planner by Stephannie Li; "Daily Record of Dysfunctional Thoughts" in Cognitive Therapy of Depression by Ashby Dawes and Shea Evans); process the journal material to challenge depressive thinking patterns and replace them with reality-based thoughts. Assign "behavioral experiments" in which depressive automatic thoughts are treated as hypotheses/prediction, reality-based alternative hypotheses/prediction are generated, and both are tested against the client's past, present, and/or future experiences. Facilitate and reinforce the client's shift from biased depressive self-talk and beliefs to reality-based cognitive messages that enhance self-confidence and increase adaptive actions (see "Positive Self-Talk" in the Adult Psychotherapy Homework Planner by Stephannie Li). Objective Learn and implement behavioral strategies to overcome depression. Target Date: 2023-02-23 Frequency: biweekly  Progress: 0 Modality: individual  Related Interventions Engage the client in "behavioral activation," increasing his/her activity level and contact with sources of reward, while identifying processes that inhibit activation (see Behavioral Activation for Depression by Katharine Look, Dimidjian, and Herman-Dunn; or assign "Identify and Schedule Pleasant Activities" in the Adult Psychotherapy Homework  Planner by [facility]); use behavioral techniques such as instruction, rehearsal, role-playing, role reversal, as needed, to facilitate activity in the client's daily life; reinforce success. Assist the client in developing skills that increase the likelihood of deriving pleasure from behavioral activation (e.g., assertiveness skills, developing an exercise plan, less internal/more external focus, increased social involvement); reinforce success. Objective Verbalize an  understanding and resolution of current interpersonal problems. Target Date: 2023-02-23 Frequency: biweekly  Progress: 0 Modality: individual  Related Interventions For role transitions (e.g., beginning or ending a relationship or career, moving, promotion, retirement, graduation), help the client mourn the loss of the old role while recognizing positive and negative aspects of the new role, and taking steps to gain mastery over the new role. Objective Learn and implement problem-solving and decision-making skills. Target Date: 2023-02-23 Frequency: biweekly  Progress: 0 Modality: individual  Objective Learn and implement conflict resolution skills to resolve interpersonal problems. Target Date: 2023-02-23 Frequency: biweekly  Progress: 0 Modality: individual  Related Interventions Teach conflict resolution skills (e.g., empathy, active listening, "I messages," respectful communication, assertiveness without aggression, compromise); use psychoeducation, modeling, role-playing, and rehearsal to work through several current conflicts; assign homework exercises; review and repeat so as to integrate their use into the client's life. Objective Provide behavioral, emotional, and attitudinal information toward an assessment of specifiers relevant to a DSM diagnosis, the efficacy of treatment, and the nature of the therapy relationship. Target Date: 2023-02-23 Frequency: biweekly  Progress: 0 Modality: individual  2. Appropriately grieve  the loss in order to normalize mood and to return to previously adaptive level of functioning. 3. Decrease the level of present conflict with parents while beginning to let go of or resolving past conflicts with them. Objective Describe the conflicts and the causes of conflicts between self and parents. Target Date: 2023-02-23 Frequency: biweekly  Progress: 0 Modality: individual  Related Interventions Give verbal permission for the client to have and express own feelings, thoughts, and perspectives in order to foster a sense of autonomy from family. Explore the nature of the client's family conflicts and their perceived causes. Objective Identify own as well as others' role in the family conflicts. Target Date: 2023-02-23 Frequency: biweekly  Progress: 0 Modality: individual  Related Interventions Confront the client when he/she is not taking responsibility for his/her role in the family conflict and reinforce the client for owning responsibility for his/her contribution to the conflict. Objective Increase the level of independent functioning. Target Date: 2023-02-23 Frequency: biweekly  Progress: 0 Modality: individual  Related Interventions Confront the client's emotional dependence and avoidance of economic responsibility that promotes continuing pattern of living with parents; develop a plan for the client's healthy and responsible emancipation from parents that is, if possible, complete with their blessing (e.g., finding and keeping a job, saving money, socializing with friends, finding own housing, etc.). Objective Report an increase in resolving conflicts with parents by talking calmly and assertively rather than aggressively and defensively. Target Date: 2023-02-23 Frequency: biweekly  Progress: 0 Modality: individual  Related Interventions Use role-playing, role reversal, modeling, and behavioral rehearsal to help the client develop assertive ways to resolve conflict with parents  (recommend Your Perfect Right: Assertiveness and Equality in Your Life and Relationships by Carnella Guadalajara). 4. Develop coping strategies (e.g., feeling identification, problem-solving, assertiveness) to address emotional issues that could lead to relapse of the eating disorder. 5. Develop healthy cognitive patterns and beliefs about self that lead to positive identity and prevent a relapse of the eating disorder. 6. Develop healthy interpersonal relationships that lead to alleviation and help prevent the relapse of the eating disorder. 7. Develop healthy interpersonal relationships that lead to the alleviation and help prevent the relapse of depression. 8. Develop healthy thinking patterns and beliefs about self, others, and the world that lead to the alleviation and help prevent the relapse of depression. 9. Enhance ability to effectively  cope with the full variety of life's worries and anxieties. 10. Learn and implement coping skills that result in a reduction of anxiety and worry, and improved daily functioning. 11. Reach a level of reduced tension, increased satisfaction, and improved communication with family and/or other authority figures. 12. Recognize, accept, and cope with feelings of depression. 13. Restore normal eating patterns, healthy weight maintenance, and a realistic appraisal of body size. Objective Honestly describe the pattern of eating including types, amounts, and frequency of food consumed or hoarded. Target Date: 2023-02-23 Frequency: biweekly  Progress: 0 Modality: individual  Related Interventions Assess the historical course of the disorder including the amount, type, and pattern of the client's food intake (e.g., too little food, too much food, binge eating, or hoarding food); perceived personal and interpersonal triggers and personal goals. Objective Establish regular eating patterns by eating at regular intervals and consuming optimal daily calories. Target Date:  2023-02-23 Frequency: biweekly  Progress: 0 Modality: individual  Related Interventions Establish healthy weight goals for the client per the Body Mass Index (BMI), the [place] Height and Weight Tables, or some other recognized standard. Objective Learn and implement skills for managing urges to engage in unhealthy eating or weight loss practices. Target Date: 2023-02-23 Frequency: biweekly  Progress: 0 Modality: individual  Related Interventions Teach the client tailored skills to manage high-risk situations including distraction, positive self-talk, problem-solving, conflict resolution (e.g., empathy, active listening, "I messages," respectful communication, assertiveness without aggression, compromise), or other social/ communication skills; use modeling, role-playing, and behavior rehearsal to work through several current situations. Objective State a basis for positive identity that is not based on weight and appearance but on character, traits, relationships, and intrinsic value. Target Date: 2023-02-23 Frequency: biweekly  Progress: 0 Modality: individual  Related Interventions Assist the client in identifying a basis for self-worth apart from body image by reviewing his/her talents, successes, positive traits, importance to others, and intrinsic spiritual value. Objective Verbalize an understanding of relapse prevention and the distinction between a lapse and a relapse. Target Date: 2023-02-23 Frequency: biweekly  Progress: 0 Modality: individual  Related Interventions Discuss with the client the distinction between a lapse and relapse, associating a lapse with an initial and reversible return of distress, urges, or to avoid, and relapse with the decision to return to the cycle of maladaptive thoughts and actions (e.g., feeling anxious, binging, then purging). Identify with the client future situations or circumstances in which lapses could occur. 14. Stabilize anxiety level while  increasing ability to function on a daily basis. Objective Learn and implement problem-solving strategies for realistically addressing worries. Target Date: 2023-02-23 Frequency: biweekly  Progress: 0 Modality: individual  Related Interventions Teach the client problem-solving strategies involving specifically defining a problem, generating options for addressing it, evaluating the pros and cons of each option, selecting and implementing an optional action, and reevaluating and refining the action (or assign "Applying Problem-Solving to Interpersonal Conflict" in the Adult Psychotherapy Homework Planner by Stephannie Li). Objective Identify, challenge, and replace biased, fearful self-talk with positive, realistic, and empowering self-talk. Target Date: 2023-02-23 Frequency: biweekly  Progress: 0 Modality: individual  Related Interventions Explore the client's schema and self-talk that mediate his/her fear response; assist him/her in challenging the biases; replace the distorted messages with reality-based alternatives and positive, realistic self-talk that will increase his/her self-confidence in coping with irrational fears (see Cognitive Therapy of Anxiety Disorders by Laurence Slate). Assign the client a homework exercise in which he/she identifies fearful self-talk, identifies biases in the self-talk, generates alternatives,  and tests through behavioral experiments (or assign "Negative Thoughts Trigger Negative Feelings" in the Adult Psychotherapy Homework Planner by [facility]); review and reinforce success, providing corrective feedback toward improvement. Objective Learn to accept limitations in life and commit to tolerating, rather than avoiding, unpleasant emotions while accomplishing meaningful goals. Target Date: 2023-02-23 Frequency: biweekly  Progress: 0 Modality: individual  Related Interventions Use techniques from Acceptance and Commitment Therapy to help client accept uncomfortable  realities such as lack of complete control, imperfections, and uncertainty and tolerate unpleasant emotions and thoughts in order to accomplish value-consistent goals. Objective Learn and implement personal and interpersonal skills to reduce anxiety and improve interpersonal relationships. Target Date: 2023-02-23 Frequency: biweekly  Progress: 0 Modality: individual  Related Interventions Use instruction, modeling, and role-playing to build the client's general social, communication, and/or conflict resolution skills. Objective Maintain involvement in work, family, and social activities. Target Date: 2023-02-23 Frequency: biweekly  Progress: 0 Modality: individual  Related Interventions Support the client in following through with work, family, and social activities rather than escaping or avoiding them to focus on anxiety. 15. Terminate overeating and implement lifestyle changes that lead to weight loss and improved health. 16. Terminate the pattern of binge eating and purging behavior with a return to eating normal amounts of nutritious foods.  Diagnosis:MDD (major depressive disorder), single episode, moderate (HCC)  Anxiety  Obsessive-compulsive disorder, unspecified type (harm) rule in  Plan:  -meet again on Thursday, September 01, 2022 at 3pm in-person

## 2022-09-01 ENCOUNTER — Encounter: Payer: Self-pay | Admitting: Professional

## 2022-09-01 ENCOUNTER — Ambulatory Visit: Payer: BC Managed Care – PPO | Admitting: Professional

## 2022-09-01 DIAGNOSIS — F321 Major depressive disorder, single episode, moderate: Secondary | ICD-10-CM

## 2022-09-01 DIAGNOSIS — F419 Anxiety disorder, unspecified: Secondary | ICD-10-CM

## 2022-09-01 DIAGNOSIS — F429 Obsessive-compulsive disorder, unspecified: Secondary | ICD-10-CM | POA: Diagnosis not present

## 2022-09-01 NOTE — Progress Notes (Signed)
[facility] Counselor/Therapist Progress Note  Patient ID: Jerry Cisneros, MRN: [id number],    Date: 09/01/2022  Time Spent: 46 minutes [id number]  Treatment Type: Individual Therapy  Risk Assessment: Danger to Self:  No Self-injurious Behavior: No Danger to Others: No  Subjective: The patient arrived late for his in-person session.   Issues addressed: 1-professional -how to get a raise, how to write a resume, whether to stay at same job or seek a new one, school in fall? 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 Alleviate depressive symptoms and return to previous level of effective functioning. Objective Describe current and past experiences with depression including their impact on functioning and attempts to resolve it. Target Date: 2023-02-23 Frequency: biweekly  Progress: 0 Modality: individual  Related Interventions Encourage the client to share his/her thoughts and feelings of depression; express empathy and build rapport while identifying primary cognitive, behavioral, interpersonal, or other contributors to depression. Assess current and past mood episodes including their features, frequency, severity, and duration (e.g., clinical interview supplemented by the Inventory to Diagnose Depression). Objective Identify and replace thoughts and beliefs that support depression. Target Date: 2023-02-23 Frequency: biweekly  Progress: 0 Modality: individual  Related Interventions Conduct Cognitive-Behavioral Therapy (see Cognitive  Behavior Therapy by Reola Calkins; Overcoming Depression by Agapito Games al.), beginning with helping the client learn the connection among cognition, depressive feelings, and actions. Assign the client to self-monitor thoughts, feelings, and actions in daily journal (e.g., "Negative Thoughts Trigger Negative Feelings" in the Adult Psychotherapy Homework Planner by Stephannie Li; "Daily Record of Dysfunctional Thoughts" in Cognitive Therapy of Depression by Ashby Dawes and Shea Evans); process the journal material to challenge depressive thinking patterns and replace them with reality-based thoughts. Assign "behavioral experiments" in which depressive automatic thoughts are treated as hypotheses/prediction, reality-based alternative hypotheses/prediction are generated, and both are tested against the client's past, present, and/or future experiences. Facilitate and reinforce the client's shift from biased depressive self-talk and beliefs to reality-based cognitive messages that enhance self-confidence and increase adaptive actions (see "Positive Self-Talk" in the Adult Psychotherapy Homework Planner by Stephannie Li). Objective Learn and implement behavioral strategies to overcome depression. Target Date: 2023-02-23 Frequency: biweekly  Progress: 0 Modality: individual  Related Interventions Engage the client in "behavioral activation," increasing his/her activity level and contact with sources of reward, while identifying processes that inhibit activation (see Behavioral Activation for Depression by Katharine Look, Dimidjian, and Herman-Dunn; or assign "Identify and Schedule Pleasant Activities" in the Adult Psychotherapy Homework Planner by [facility]); use behavioral techniques such as instruction, rehearsal, role-playing, role reversal, as needed, to facilitate activity in the client's daily life; reinforce success. Assist the client in developing skills that increase the likelihood of deriving pleasure from behavioral activation  (e.g., assertiveness skills, developing an exercise plan, less internal/more external focus, increased social involvement); reinforce success. Objective Verbalize an understanding and resolution of current interpersonal problems. Target Date: 2023-02-23 Frequency: biweekly  Progress: 0 Modality: individual  Related Interventions For role transitions (e.g., beginning or ending a relationship or career, moving, promotion, retirement, graduation), help the client mourn the loss of the old role while recognizing positive and negative  aspects of the new role, and taking steps to gain mastery over the new role. Objective Learn and implement problem-solving and decision-making skills. Target Date: 2023-02-23 Frequency: biweekly  Progress: 0 Modality: individual  Objective Learn and implement conflict resolution skills to resolve interpersonal problems. Target Date: 2023-02-23 Frequency: biweekly  Progress: 0 Modality: individual  Related Interventions Teach conflict resolution skills (e.g., empathy, active listening, "I messages," respectful communication, assertiveness without aggression, compromise); use psychoeducation, modeling, role-playing, and rehearsal to work through several current conflicts; assign homework exercises; review and repeat so as to integrate their use into the client's life. Objective Provide behavioral, emotional, and attitudinal information toward an assessment of specifiers relevant to a DSM diagnosis, the efficacy of treatment, and the nature of the therapy relationship. Target Date: 2023-02-23 Frequency: biweekly  Progress: 0 Modality: individual  2. Appropriately grieve the loss in order to normalize mood and to return to previously adaptive level of functioning. 3. Decrease the level of present conflict with parents while beginning to let go of or resolving past conflicts with them. Objective Describe the conflicts and the causes of conflicts between self and  parents. Target Date: 2023-02-23 Frequency: biweekly  Progress: 0 Modality: individual  Related Interventions Give verbal permission for the client to have and express own feelings, thoughts, and perspectives in order to foster a sense of autonomy from family. Explore the nature of the client's family conflicts and their perceived causes. Objective Identify own as well as others' role in the family conflicts. Target Date: 2023-02-23 Frequency: biweekly  Progress: 0 Modality: individual  Related Interventions Confront the client when he/she is not taking responsibility for his/her role in the family conflict and reinforce the client for owning responsibility for his/her contribution to the conflict. Objective Increase the level of independent functioning. Target Date: 2023-02-23 Frequency: biweekly  Progress: 0 Modality: individual  Related Interventions Confront the client's emotional dependence and avoidance of economic responsibility that promotes continuing pattern of living with parents; develop a plan for the client's healthy and responsible emancipation from parents that is, if possible, complete with their blessing (e.g., finding and keeping a job, saving money, socializing with friends, finding own housing, etc.). Objective Report an increase in resolving conflicts with parents by talking calmly and assertively rather than aggressively and defensively. Target Date: 2023-02-23 Frequency: biweekly  Progress: 0 Modality: individual  Related Interventions Use role-playing, role reversal, modeling, and behavioral rehearsal to help the client develop assertive ways to resolve conflict with parents (recommend Your Perfect Right: Assertiveness and Equality in Your Life and Relationships by Carnella Guadalajara). 4. Develop coping strategies (e.g., feeling identification, problem-solving, assertiveness) to address emotional issues that could lead to relapse of the eating disorder. 5. Develop  healthy cognitive patterns and beliefs about self that lead to positive identity and prevent a relapse of the eating disorder. 6. Develop healthy interpersonal relationships that lead to alleviation and help prevent the relapse of the eating disorder. 7. Develop healthy interpersonal relationships that lead to the alleviation and help prevent the relapse of depression. 8. Develop healthy thinking patterns and beliefs about self, others, and the world that lead to the alleviation and help prevent the relapse of depression. 9. Enhance ability to effectively cope with the full variety of life's worries and anxieties. 10. Learn and implement coping skills that result in a reduction of anxiety and worry, and improved daily functioning. 11. Reach a level of reduced tension, increased satisfaction, and improved communication with family and/or other authority figures. 12. Recognize, accept,  and cope with feelings of depression. 13. Restore normal eating patterns, healthy weight maintenance, and a realistic appraisal of body size. Objective Honestly describe the pattern of eating including types, amounts, and frequency of food consumed or hoarded. Target Date: 2023-02-23 Frequency: biweekly  Progress: 0 Modality: individual  Related Interventions Assess the historical course of the disorder including the amount, type, and pattern of the client's food intake (e.g., too little food, too much food, binge eating, or hoarding food); perceived personal and interpersonal triggers and personal goals. Objective Establish regular eating patterns by eating at regular intervals and consuming optimal daily calories. Target Date: 2023-02-23 Frequency: biweekly  Progress: 0 Modality: individual  Related Interventions Establish healthy weight goals for the client per the Body Mass Index (BMI), the [employer] Height and Weight Tables, or some other recognized standard. Objective Learn and implement skills for  managing urges to engage in unhealthy eating or weight loss practices. Target Date: 2023-02-23 Frequency: biweekly  Progress: 0 Modality: individual  Related Interventions Teach the client tailored skills to manage high-risk situations including distraction, positive self-talk, problem-solving, conflict resolution (e.g., empathy, active listening, "I messages," respectful communication, assertiveness without aggression, compromise), or other social/ communication skills; use modeling, role-playing, and behavior rehearsal to work through several current situations. Objective State a basis for positive identity that is not based on weight and appearance but on character, traits, relationships, and intrinsic value. Target Date: 2023-02-23 Frequency: biweekly  Progress: 0 Modality: individual  Related Interventions Assist the client in identifying a basis for self-worth apart from body image by reviewing his/her talents, successes, positive traits, importance to others, and intrinsic spiritual value. Objective Verbalize an understanding of relapse prevention and the distinction between a lapse and a relapse. Target Date: 2023-02-23 Frequency: biweekly  Progress: 0 Modality: individual  Related Interventions Discuss with the client the distinction between a lapse and relapse, associating a lapse with an initial and reversible return of distress, urges, or to avoid, and relapse with the decision to return to the cycle of maladaptive thoughts and actions (e.g., feeling anxious, binging, then purging). Identify with the client future situations or circumstances in which lapses could occur. 14. Stabilize anxiety level while increasing ability to function on a daily basis. Objective Learn and implement problem-solving strategies for realistically addressing worries. Target Date: 2023-02-23 Frequency: biweekly  Progress: 0 Modality: individual  Related Interventions Teach the client problem-solving  strategies involving specifically defining a problem, generating options for addressing it, evaluating the pros and cons of each option, selecting and implementing an optional action, and reevaluating and refining the action (or assign "Applying Problem-Solving to Interpersonal Conflict" in the Adult Psychotherapy Homework Planner by Stephannie Li). Objective Identify, challenge, and replace biased, fearful self-talk with positive, realistic, and empowering self-talk. Target Date: 2023-02-23 Frequency: biweekly  Progress: 0 Modality: individual  Related Interventions Explore the client's schema and self-talk that mediate his/her fear response; assist him/her in challenging the biases; replace the distorted messages with reality-based alternatives and positive, realistic self-talk that will increase his/her self-confidence in coping with irrational fears (see Cognitive Therapy of Anxiety Disorders by Laurence Slate). Assign the client a homework exercise in which he/she identifies fearful self-talk, identifies biases in the self-talk, generates alternatives, and tests through behavioral experiments (or assign "Negative Thoughts Trigger Negative Feelings" in the Adult Psychotherapy Homework Planner by [facility]); review and reinforce success, providing corrective feedback toward improvement. Objective Learn to accept limitations in life and commit to tolerating, rather than avoiding, unpleasant emotions while accomplishing meaningful goals. Target  Date: 2023-02-23 Frequency: biweekly  Progress: 0 Modality: individual  Related Interventions Use techniques from Acceptance and Commitment Therapy to help client accept uncomfortable realities such as lack of complete control, imperfections, and uncertainty and tolerate unpleasant emotions and thoughts in order to accomplish value-consistent goals. Objective Learn and implement personal and interpersonal skills to reduce anxiety and improve interpersonal  relationships. Target Date: 2023-02-23 Frequency: biweekly  Progress: 0 Modality: individual  Related Interventions Use instruction, modeling, and role-playing to build the client's general social, communication, and/or conflict resolution skills. Objective Maintain involvement in work, family, and social activities. Target Date: 2023-02-23 Frequency: biweekly  Progress: 0 Modality: individual  Related Interventions Support the client in following through with work, family, and social activities rather than escaping or avoiding them to focus on anxiety. 15. Terminate overeating and implement lifestyle changes that lead to weight loss and improved health. 16. Terminate the pattern of binge eating and purging behavior with a return to eating normal amounts of nutritious foods.  Diagnosis:MDD (major depressive disorder), single episode, moderate (HCC)  Anxiety  Obsessive-compulsive disorder, unspecified type (harm) rule in  Plan:  -meet again on Thursday, September 29, 2022 at 3pm in-person

## 2022-09-20 ENCOUNTER — Encounter (HOSPITAL_COMMUNITY): Payer: Self-pay | Admitting: Psychiatry

## 2022-09-20 ENCOUNTER — Ambulatory Visit (HOSPITAL_COMMUNITY): Payer: BC Managed Care – PPO | Admitting: Psychiatry

## 2022-09-20 VITALS — BP 137/87 | HR 73 | Ht 68.0 in | Wt 243.0 lb

## 2022-09-20 DIAGNOSIS — F429 Obsessive-compulsive disorder, unspecified: Secondary | ICD-10-CM

## 2022-09-20 DIAGNOSIS — F419 Anxiety disorder, unspecified: Secondary | ICD-10-CM

## 2022-09-20 DIAGNOSIS — F321 Major depressive disorder, single episode, moderate: Secondary | ICD-10-CM

## 2022-09-20 MED ORDER — FLUOXETINE HCL 10 MG PO CAPS: 10.0000 mg | ORAL_CAPSULE | Freq: Every day | ORAL | 0 refills | Status: DC

## 2022-09-20 NOTE — Progress Notes (Signed)
Psychiatric Initial Adult Assessment   Patient Identification: Jerry Cisneros MRN:  [id number] Date of Evaluation:  09/20/2022 Referral Source: primary care Chief Complaint:   Chief Complaint  Patient presents with   New Patient (Initial Visit)   Depression   Anxiety   Visit Diagnosis:    ICD-10-CM   1. MDD (major depressive disorder), single episode, moderate (HCC)  F32.1     2. Obsessive-compulsive disorder, unspecified type  F42.9     3. Anxiety  F41.9       History of Present Illness: Patient is a 22 years old white male living with his parents and younger brother referred by primary care physician to establish care he is seeing therapist Teofilo Pod referred for assessment for of depression and anxiety and OCD.  Currently is not on any medication he works at a warehouse  Patient gives a history of depression starting around age 69 he was going in a charter school but was feeling depressed different and has been bullied to some extent.  Also difficult growing up with the parents were strict controlling and religious with everything decided charge submitted would be send including watching television.  He felt depressed and not connected with his parents even now he does not feel that he is connected with them.  Also not with his brother either When he is working he is able to distract from the sexual thoughts or the obsessions or intrusive obsessions.  He feels been there is not much going on and he is by himself he gets these thoughts and is able to distract at times when he gets busy  And regarding depression he does feel a motivated withdrawn at times disturbed sleep energy and at times feeling of despair and hopelessness but not suicidal thoughts  He did finish high school and has been working he plans to go to [employer] maybe in the next year or so  Recent referral is because of some intrusive thoughts including as if he is someone who would beat people.  He does  not have any intent to do any harmful things to himself or anybody else but sometimes he has fantasies of people covered with chocolate and what would be like if they are licked.  He also has  Failure sexual intrusive thoughts he is able to distract himself.  At times his compulsiveness eating that helps for him as a distraction and he has gained weight because of these compulsions   Does not have much friends except for online friends that he spends time and charting.  Does endorse worries at times excessive about the future about free-floating anxiety.  He is seeing a therapist starting at age 22 but has been reluctant on being on medication because of his parents being strict about that  He is open to discussion about medication now  Does not endorse otherwise hallucinations or hearing voices or paranoia  There is no clear manic symptoms there has been some energy full days or 1 day that he would be high energy mind will be racing but then it with come back the next day with no significant erratic behavior or psychotic-like symptoms   Aggravating factors:  difficult growing up , religious and strict parents, obsessive thoughts of eating, fantasies Modifying factors: online chats , friends mostly on line,   Duration more so age 22 and after Prior psych admission denies Suicide attempt denies  Drug or alcohol use denies  Past Psychiatric History: depression, anxiety   Previous Psychotropic  Medications:   Substance Abuse History in the last 12 months:  No.  Consequences of Substance Abuse: NA  Past Medical History: No past medical history on file.  Past Surgical History:  Procedure Laterality Date   NO PAST SURGERIES      Family Psychiatric History: dad questionable: mom possible depression, one uncle many hospital admissions. Diagnosis not known  Family History:  Family History  Problem Relation Age of Onset   Psoriasis Father     Social History:   Social History    Socioeconomic History   Marital status: Single    Spouse name: Not on file   Number of children: Not on file   Years of education: Not on file   Highest education level: Not on file  Occupational History   Occupation: Student  Tobacco Use   Smoking status: Never   Smokeless tobacco: Never  Substance and Sexual Activity   Alcohol use: Not on file   Drug use: Not on file   Sexual activity: Not on file  Other Topics Concern   Not on file  Social History Narrative   Mother is from [place] originally.    Social Determinants of Health   Financial Resource Strain: Not on file  Food Insecurity: Not on file  Transportation Needs: Not on file  Physical Activity: Not on file  Stress: Not on file  Social Connections: Not on file    Additional Social History: grew up with parents and young sibling brother. Strict controlling family, reglegious, had to go to church everything was sin if watch tv or earthly related. Difficult growing up  Allergies:   Allergies  Allergen Reactions   Erythromycin     Upset stomach    Metabolic Disorder Labs: No results found for: "HGBA1C", "MPG" No results found for: "PROLACTIN" Lab Results  Component Value Date   CHOL 162 12/31/2021   TRIG 54 12/31/2021   HDL 51 12/31/2021   CHOLHDL 3.2 12/31/2021   LDLCALC 97 12/31/2021   LDLCALC 81 01/10/2019   No results found for: "TSH"  Therapeutic Level Labs: No results found for: "LITHIUM" No results found for: "CBMZ" No results found for: "VALPROATE"  Current Medications: No current outpatient medications on file.   No current facility-administered medications for this visit.     Psychiatric Specialty Exam: Review of Systems  Cardiovascular:  Negative for chest pain.  Neurological:  Negative for tremors.  Psychiatric/Behavioral:  Positive for dysphoric mood. Negative for agitation. The patient is nervous/anxious.     Blood pressure 137/87, pulse 73, height 5\' 8"  (1.727 m), weight 243  lb (110.2 kg).Body mass index is 36.95 kg/m.  General Appearance: Casual  Eye Contact:  Fair  Speech:  Clear and Coherent  Volume:  Normal  Mood:  Dysphoric  Affect:  Constricted  Thought Process:  Goal Directed  Orientation:  Full (Time, Place, and Person)  Thought Content:  Obsessions and Rumination  Suicidal Thoughts:  No  Homicidal Thoughts:  No  Memory:  Immediate;   Fair  Judgement:  Fair  Insight:  Shallow  Psychomotor Activity:  Normal  Concentration:  Concentration: Fair  Recall:  [employer] of Knowledge:Good  Language: Fair  Akathisia:  No  Handed:    AIMS (if indicated):  not done  Assets:  Desire for Improvement Housing Physical Health  ADL's:  Intact  Cognition: WNL  Sleep:  Fair   Screenings: GAD-7    Advertising copywriter from 08/04/2022 in [facility] Behavioral Medicine at  [facility] Counselor from 02/10/2022 in [facility] Behavioral Medicine at [facility] Office Visit from 05/18/2021 in [facility] Primary Care & Sports Medicine at [facility] Office Visit from 01[facility]14/2019 in [facility] Primary Care & Sports Medicine at [facility]  Total GAD-7 Score 8 9 10 4       PHQ2-9    Flowsheet Row Office Visit from 09/20/2022 in [facility] Health Outpatient Behavioral Health at [facility] Counselor from 08/04/2022 in [facility] Behavioral Medicine at [facility] Counselor from 02/10/2022 in [facility] Behavioral Medicine at [facility] Office Visit from 12/21/2021 in [facility] Primary Care & Sports Medicine at [facility] Office Visit from 05/18/2021 in [facility] Primary Care & Sports Medicine at [facility] Total Score 2 3 1 2 4   PHQ-9 Total Score 16 17 14 5 20       SBQ-R    Flowsheet Row Counselor from 08/04/2022 in [facility] Behavioral Medicine at [facility]  SBQ-R Total Score 8      Flowsheet Row  Office Visit from 09/20/2022 in [facility] Health Outpatient Behavioral Health at [facility]  C-SSRS RISK CATEGORY Error: Q3, 4, or 5 should not be populated when Q2 is No       Assessment and Plan: as follows  Major depressive disorder recurrent moderate to severe; he is open to discussion about medication we opted Prozac since April helped with depression anxiety and possible OCD including binge eating that he does  Generalized anxiety disorder; start Prozac 10 mg increased to 20 mg recommend to continue therapy to work on the distraction of compulsive behavior or obsessions  OCD; possible related to depression possible related to depression as going up of being on the strict family control and religious believes continue therapy at Prozac as above  Discussed distraction to add activities to work on having something positive to do and for distraction  Reviewed questions questions addressed medication reviewed follow-up in 3 to 4 weeks or earlier if needed in case symptoms are worsening or depression is worsening to call 911 for any suicidal thoughts or related behavior and to keep early appointment if needed for any worsening of symptoms  Direct care time spent 65  minutes with chart review, documentation elaboration and face-to-face Collaboration of Care: Primary Care Provider AEB notes and chart reviewed  Patient/Guardian was advised Release of Information must be obtained prior to any record release in order to collaborate their care with an outside provider. Patient/Guardian was advised if they have not already done so to contact the registration department to sign all necessary forms in order for [place] to release information regarding their care.   Consent: Patient/Guardian gives verbal consent for treatment and assignment of benefits for services provided during this visit. Patient/Guardian expressed understanding and agreed to proceed.   Thresa Ross, MD 7/16/20242:14 PM

## 2022-09-29 ENCOUNTER — Encounter: Payer: Self-pay | Admitting: Professional

## 2022-09-29 ENCOUNTER — Ambulatory Visit: Payer: BC Managed Care – PPO | Admitting: Professional

## 2022-09-29 DIAGNOSIS — F429 Obsessive-compulsive disorder, unspecified: Secondary | ICD-10-CM | POA: Diagnosis not present

## 2022-09-29 DIAGNOSIS — F419 Anxiety disorder, unspecified: Secondary | ICD-10-CM

## 2022-09-29 DIAGNOSIS — F321 Major depressive disorder, single episode, moderate: Secondary | ICD-10-CM | POA: Diagnosis not present

## 2022-09-29 NOTE — Progress Notes (Signed)
[facility] Counselor/Therapist Progress Note  Patient ID: Jerry Cisneros, MRN: [id number],    Date: 09/29/2022  Time Spent: 31 minutes [id number]  Treatment Type: Individual Therapy  Risk Assessment: Danger to Self:  No Self-injurious Behavior: No Danger to Others: No  Subjective: The patient arrived late for his in-person session.   Issues addressed: 1-professional -experiencing some stress today due to work issues -the senior leader over his boss returned from vacation and his boss left -errors in work performance/tasks were discovered and pt was called out -errors were not identified prior by his boss who apparently was not checking -senior leader annoyed and not in good mood causing pt to feel uncomfortable 2-mental health -met with Dr. Fredda Hammed who prescribed Prozac 10mg  to 20mg  -pt thinks appointment went fine -he is concerned about potential side effects but will continue medication until he sees MD in 4 weeks -he believes he is having less obsessive thinking 3-online friend -pt sheepishly shared that he had an online sexual encounter with a male friend he has known for a year -he is unsure what this means moving forward but was a pleasant experience -this friend lives in [place] so patient does not see real potential for a relationship   Treatment Plan Problems Addressed  Anxiety, Eating Disorders And Obesity, Family Conflict, Unipolar Depression Goals 1. Alleviate depressive symptoms and return to previous level of effective functioning. Objective Describe current and past experiences with depression including their impact on functioning and attempts to resolve it. Target Date: 2023-02-23 Frequency: biweekly  Progress: 0 Modality: individual  Related Interventions Encourage the client to share his/her thoughts and feelings of depression; express empathy and build rapport while identifying primary cognitive, behavioral, interpersonal, or other  contributors to depression. Assess current and past mood episodes including their features, frequency, severity, and duration (e.g., clinical interview supplemented by the Inventory to Diagnose Depression). Objective Identify and replace thoughts and beliefs that support depression. Target Date: 2023-02-23 Frequency: biweekly  Progress: 0 Modality: individual  Related Interventions Conduct Cognitive-Behavioral Therapy (see Cognitive Behavior Therapy by Reola Calkins; Overcoming Depression by Agapito Games al.), beginning with helping the client learn the connection among cognition, depressive feelings, and actions. Assign the client to self-monitor thoughts, feelings, and actions in daily journal (e.g., "Negative Thoughts Trigger Negative Feelings" in the Adult Psychotherapy Homework Planner by Stephannie Li; "Daily Record of Dysfunctional Thoughts" in Cognitive Therapy of Depression by Ashby Dawes and Shea Evans); process the journal material to challenge depressive thinking patterns and replace them with reality-based thoughts. Assign "behavioral experiments" in which depressive automatic thoughts are treated as hypotheses/prediction, reality-based alternative hypotheses/prediction are generated, and both are tested against the client's past, present, and/or future experiences. Facilitate and reinforce the client's shift from biased depressive self-talk and beliefs to reality-based cognitive messages that enhance self-confidence and increase adaptive actions (see "Positive Self-Talk" in the Adult Psychotherapy Homework Planner by Stephannie Li). Objective Learn and implement behavioral strategies to overcome depression. Target Date: 2023-02-23 Frequency: biweekly  Progress: 0 Modality: individual  Related Interventions Engage the client in "behavioral activation," increasing his/her activity level and contact with sources of reward, while identifying processes that inhibit activation (see Behavioral Activation for  Depression by Katharine Look, Dimidjian, and Herman-Dunn; or assign "Identify and Schedule Pleasant Activities" in the Adult Psychotherapy Homework Planner by [facility]); use behavioral techniques such as instruction, rehearsal, role-playing, role reversal, as needed, to facilitate activity in the client's daily life; reinforce success. Assist the client in developing skills that increase the likelihood of deriving pleasure from behavioral  activation (e.g., assertiveness skills, developing an exercise plan, less internal/more external focus, increased social involvement); reinforce success. Objective Verbalize an understanding and resolution of current interpersonal problems. Target Date: 2023-02-23 Frequency: biweekly  Progress: 0 Modality: individual  Related Interventions For role transitions (e.g., beginning or ending a relationship or career, moving, promotion, retirement, graduation), help the client mourn the loss of the old role while recognizing positive and negative aspects of the new role, and taking steps to gain mastery over the new role. Objective Learn and implement problem-solving and decision-making skills. Target Date: 2023-02-23 Frequency: biweekly  Progress: 0 Modality: individual  Objective Learn and implement conflict resolution skills to resolve interpersonal problems. Target Date: 2023-02-23 Frequency: biweekly  Progress: 0 Modality: individual  Related Interventions Teach conflict resolution skills (e.g., empathy, active listening, "I messages," respectful communication, assertiveness without aggression, compromise); use psychoeducation, modeling, role-playing, and rehearsal to work through several current conflicts; assign homework exercises; review and repeat so as to integrate their use into the client's life. Objective Provide behavioral, emotional, and attitudinal information toward an assessment of specifiers relevant to a DSM diagnosis, the efficacy of treatment, and the  nature of the therapy relationship. Target Date: 2023-02-23 Frequency: biweekly  Progress: 0 Modality: individual  2. Appropriately grieve the loss in order to normalize mood and to return to previously adaptive level of functioning. 3. Decrease the level of present conflict with parents while beginning to let go of or resolving past conflicts with them. Objective Describe the conflicts and the causes of conflicts between self and parents. Target Date: 2023-02-23 Frequency: biweekly  Progress: 0 Modality: individual  Related Interventions Give verbal permission for the client to have and express own feelings, thoughts, and perspectives in order to foster a sense of autonomy from family. Explore the nature of the client's family conflicts and their perceived causes. Objective Identify own as well as others' role in the family conflicts. Target Date: 2023-02-23 Frequency: biweekly  Progress: 0 Modality: individual  Related Interventions Confront the client when he/she is not taking responsibility for his/her role in the family conflict and reinforce the client for owning responsibility for his/her contribution to the conflict. Objective Increase the level of independent functioning. Target Date: 2023-02-23 Frequency: biweekly  Progress: 0 Modality: individual  Related Interventions Confront the client's emotional dependence and avoidance of economic responsibility that promotes continuing pattern of living with parents; develop a plan for the client's healthy and responsible emancipation from parents that is, if possible, complete with their blessing (e.g., finding and keeping a job, saving money, socializing with friends, finding own housing, etc.). Objective Report an increase in resolving conflicts with parents by talking calmly and assertively rather than aggressively and defensively. Target Date: 2023-02-23 Frequency: biweekly  Progress: 0 Modality: individual  Related  Interventions Use role-playing, role reversal, modeling, and behavioral rehearsal to help the client develop assertive ways to resolve conflict with parents (recommend Your Perfect Right: Assertiveness and Equality in Your Life and Relationships by Carnella Guadalajara). 4. Develop coping strategies (e.g., feeling identification, problem-solving, assertiveness) to address emotional issues that could lead to relapse of the eating disorder. 5. Develop healthy cognitive patterns and beliefs about self that lead to positive identity and prevent a relapse of the eating disorder. 6. Develop healthy interpersonal relationships that lead to alleviation and help prevent the relapse of the eating disorder. 7. Develop healthy interpersonal relationships that lead to the alleviation and help prevent the relapse of depression. 8. Develop healthy thinking patterns and beliefs about self, others,  and the world that lead to the alleviation and help prevent the relapse of depression. 9. Enhance ability to effectively cope with the full variety of life's worries and anxieties. 10. Learn and implement coping skills that result in a reduction of anxiety and worry, and improved daily functioning. 11. Reach a level of reduced tension, increased satisfaction, and improved communication with family and/or other authority figures. 12. Recognize, accept, and cope with feelings of depression. 13. Restore normal eating patterns, healthy weight maintenance, and a realistic appraisal of body size. Objective Honestly describe the pattern of eating including types, amounts, and frequency of food consumed or hoarded. Target Date: 2023-02-23 Frequency: biweekly  Progress: 0 Modality: individual  Related Interventions Assess the historical course of the disorder including the amount, type, and pattern of the client's food intake (e.g., too little food, too much food, binge eating, or hoarding food); perceived personal and interpersonal  triggers and personal goals. Objective Establish regular eating patterns by eating at regular intervals and consuming optimal daily calories. Target Date: 2023-02-23 Frequency: biweekly  Progress: 0 Modality: individual  Related Interventions Establish healthy weight goals for the client per the Body Mass Index (BMI), the [place] Height and Weight Tables, or some other recognized standard. Objective Learn and implement skills for managing urges to engage in unhealthy eating or weight loss practices. Target Date: 2023-02-23 Frequency: biweekly  Progress: 0 Modality: individual  Related Interventions Teach the client tailored skills to manage high-risk situations including distraction, positive self-talk, problem-solving, conflict resolution (e.g., empathy, active listening, "I messages," respectful communication, assertiveness without aggression, compromise), or other social/ communication skills; use modeling, role-playing, and behavior rehearsal to work through several current situations. Objective State a basis for positive identity that is not based on weight and appearance but on character, traits, relationships, and intrinsic value. Target Date: 2023-02-23 Frequency: biweekly  Progress: 0 Modality: individual  Related Interventions Assist the client in identifying a basis for self-worth apart from body image by reviewing his/her talents, successes, positive traits, importance to others, and intrinsic spiritual value. Objective Verbalize an understanding of relapse prevention and the distinction between a lapse and a relapse. Target Date: 2023-02-23 Frequency: biweekly  Progress: 0 Modality: individual  Related Interventions Discuss with the client the distinction between a lapse and relapse, associating a lapse with an initial and reversible return of distress, urges, or to avoid, and relapse with the decision to return to the cycle of maladaptive thoughts and actions (e.g., feeling  anxious, binging, then purging). Identify with the client future situations or circumstances in which lapses could occur. 14. Stabilize anxiety level while increasing ability to function on a daily basis. Objective Learn and implement problem-solving strategies for realistically addressing worries. Target Date: 2023-02-23 Frequency: biweekly  Progress: 0 Modality: individual  Related Interventions Teach the client problem-solving strategies involving specifically defining a problem, generating options for addressing it, evaluating the pros and cons of each option, selecting and implementing an optional action, and reevaluating and refining the action (or assign "Applying Problem-Solving to Interpersonal Conflict" in the Adult Psychotherapy Homework Planner by Stephannie Li). Objective Identify, challenge, and replace biased, fearful self-talk with positive, realistic, and empowering self-talk. Target Date: 2023-02-23 Frequency: biweekly  Progress: 0 Modality: individual  Related Interventions Explore the client's schema and self-talk that mediate his/her fear response; assist him/her in challenging the biases; replace the distorted messages with reality-based alternatives and positive, realistic self-talk that will increase his/her self-confidence in coping with irrational fears (see Cognitive Therapy of Anxiety Disorders by Chestine Spore and  Beck). Assign the client a homework exercise in which he/she identifies fearful self-talk, identifies biases in the self-talk, generates alternatives, and tests through behavioral experiments (or assign "Negative Thoughts Trigger Negative Feelings" in the Adult Psychotherapy Homework Planner by [facility]); review and reinforce success, providing corrective feedback toward improvement. Objective Learn to accept limitations in life and commit to tolerating, rather than avoiding, unpleasant emotions while accomplishing meaningful goals. Target Date: 2023-02-23 Frequency:  biweekly  Progress: 0 Modality: individual  Related Interventions Use techniques from Acceptance and Commitment Therapy to help client accept uncomfortable realities such as lack of complete control, imperfections, and uncertainty and tolerate unpleasant emotions and thoughts in order to accomplish value-consistent goals. Objective Learn and implement personal and interpersonal skills to reduce anxiety and improve interpersonal relationships. Target Date: 2023-02-23 Frequency: biweekly  Progress: 0 Modality: individual  Related Interventions Use instruction, modeling, and role-playing to build the client's general social, communication, and/or conflict resolution skills. Objective Maintain involvement in work, family, and social activities. Target Date: 2023-02-23 Frequency: biweekly  Progress: 0 Modality: individual  Related Interventions Support the client in following through with work, family, and social activities rather than escaping or avoiding them to focus on anxiety. 15. Terminate overeating and implement lifestyle changes that lead to weight loss and improved health. 16. Terminate the pattern of binge eating and purging behavior with a return to eating normal amounts of nutritious foods.  Diagnosis:MDD (major depressive disorder), single episode, moderate (HCC)  Obsessive-compulsive disorder, unspecified type  Anxiety  Plan:  -meet again on Thursday, October 13, 2022 at 3pm in-person

## 2022-10-06 ENCOUNTER — Ambulatory Visit: Payer: BC Managed Care – PPO | Admitting: Professional

## 2022-10-13 ENCOUNTER — Ambulatory Visit: Payer: BC Managed Care – PPO | Admitting: Professional

## 2022-10-18 ENCOUNTER — Encounter (HOSPITAL_COMMUNITY): Payer: Self-pay | Admitting: Psychiatry

## 2022-10-18 ENCOUNTER — Ambulatory Visit (HOSPITAL_COMMUNITY): Payer: BC Managed Care – PPO | Admitting: Psychiatry

## 2022-10-18 VITALS — BP 119/83 | HR 69 | Ht 68.0 in | Wt 240.0 lb

## 2022-10-18 DIAGNOSIS — F321 Major depressive disorder, single episode, moderate: Secondary | ICD-10-CM | POA: Diagnosis not present

## 2022-10-18 DIAGNOSIS — F429 Obsessive-compulsive disorder, unspecified: Secondary | ICD-10-CM | POA: Diagnosis not present

## 2022-10-18 DIAGNOSIS — F419 Anxiety disorder, unspecified: Secondary | ICD-10-CM | POA: Diagnosis not present

## 2022-10-18 MED ORDER — FLUOXETINE HCL 20 MG PO CAPS: 20.0000 mg | ORAL_CAPSULE | Freq: Every day | ORAL | 0 refills | Status: DC

## 2022-10-18 NOTE — Progress Notes (Signed)
[facility] Follow up visit  Patient Identification: Abbot Ballog MRN:  [id number] Date of Evaluation:  10/18/2022 Referral Source: primary care Chief Complaint:   Chief Complaint  Patient presents with   Follow-up  Depression  Visit Diagnosis:    ICD-10-CM   1. MDD (major depressive disorder), single episode, moderate (HCC)  F32.1     2. Obsessive-compulsive disorder, unspecified type  F42.9     3. Anxiety  F41.9       History of Present Illness: Patient is a 22 years old white male living with his parents and younger brother initially referred by primary care physician to establish care he is seeing therapist Teofilo Pod referred for assessment for of depression and anxiety and OCD.  Currently is not on any medication he works at a warehouse  Patient gives a history of depression starting around age 37  Also difficult growing up with the parents were strict controlling and religious Last visit was having sexual intrusive thoughts and harming toughts but says would not act on it  Increase and bing eating  He agreed to starting prozac  Now at 20mg , some better in regard to eating habits its improved, depression and amotivation is there but improvement in sexual intrusive thoughts   He did finish high school and has been working he plans to go to [employer] maybe in the next year or so     Does not have much friends except for online friends that he spends time and charting.   There is no clear manic symptoms   Aggravating factors:  difficult growing up  , religious and strict parents, obsessive thoughts of eating, fantasies Modifying factors: online chats ,online friends  Duration since age 39  Prior psych admission denies Suicide attempt denies  Drug or alcohol use denies  Past Psychiatric History: depression, anxiety   Previous Psychotropic Medications:   Substance Abuse History in the last 12 months:  No.  Consequences of Substance Abuse: NA  Past  Medical History: No past medical history on file.  Past Surgical History:  Procedure Laterality Date   NO PAST SURGERIES      Family Psychiatric History: dad questionable: mom possible depression, one uncle many hospital admissions. Diagnosis not known  Family History:  Family History  Problem Relation Age of Onset   Psoriasis Father     Social History:   Social History   Socioeconomic History   Marital status: Single    Spouse name: Not on file   Number of children: Not on file   Years of education: Not on file   Highest education level: Not on file  Occupational History   Occupation: Student  Tobacco Use   Smoking status: Never   Smokeless tobacco: Never  Substance and Sexual Activity   Alcohol use: Not on file   Drug use: Not on file   Sexual activity: Not on file  Other Topics Concern   Not on file  Social History Narrative   Mother is from [place] originally.    Social Determinants of Health   Financial Resource Strain: Not on file  Food Insecurity: Not on file  Transportation Needs: Not on file  Physical Activity: Not on file  Stress: Not on file  Social Connections: Not on file    Additional Social History: grew up with parents and young sibling brother. Strict controlling family, reglegious, had to go to church everything was sin if watch tv or earthly related. Difficult growing up  Allergies:   Allergies  Allergen Reactions   Erythromycin     Upset stomach    Metabolic Disorder Labs: No results found for: "HGBA1C", "MPG" No results found for: "PROLACTIN" Lab Results  Component Value Date   CHOL 162 12/31/2021   TRIG 54 12/31/2021   HDL 51 12/31/2021   CHOLHDL 3.2 12/31/2021   LDLCALC 97 12/31/2021   LDLCALC 81 01/10/2019   No results found for: "TSH"  Therapeutic Level Labs: No results found for: "LITHIUM" No results found for: "CBMZ" No results found for: "VALPROATE"  Current Medications: Current Outpatient Medications  Medication  Sig Dispense Refill   FLUoxetine (PROZAC) 20 MG capsule Take 1 capsule (20 mg total) by mouth daily. One a day 30 capsule 0   No current facility-administered medications for this visit.     Psychiatric Specialty Exam: Review of Systems  Cardiovascular:  Negative for chest pain.  Neurological:  Negative for tremors.  Psychiatric/Behavioral:  Positive for dysphoric mood. Negative for agitation.     Blood pressure 119/83, pulse 69, height 5\' 8"  (1.727 m), weight 240 lb (108.9 kg).Body mass index is 36.49 kg/m.  General Appearance: Casual  Eye Contact:  Fair  Speech:  Clear and Coherent  Volume:  Normal  Mood:  subdued  Affect:  Constricted  Thought Process:  Goal Directed  Orientation:  Full (Time, Place, and Person)  Thought Content:  Obsessions and Rumination  Suicidal Thoughts:  No  Homicidal Thoughts:  No  Memory:  Immediate;   Fair  Judgement:  Fair  Insight:  Shallow  Psychomotor Activity:  Normal  Concentration:  Concentration: Fair  Recall:  Fair  Fund of Knowledge:Good  Language: Fair  Akathisia:  No  Handed:    AIMS (if indicated):  not done  Assets:  Desire for Improvement Housing Physical Health  ADL's:  Intact  Cognition: WNL  Sleep:  Fair   Screenings: GAD-7    Advertising copywriter from 08/04/2022 in [facility] Behavioral Medicine at [employer] Counselor from 02/10/2022 in [facility] Behavioral Medicine at [facility] Office Visit from 05/18/2021 in [facility] Primary Care & Sports Medicine at [facility] Office Visit from 01/14/2019 in [facility] Primary Care & Sports Medicine at [facility]  Total GAD-7 Score 8 9 10 4       PHQ2-9    Flowsheet Row Office Visit from 09/20/2022 in [facility] at [facility] Counselor from 08/04/2022 in [facility] Behavioral Medicine at [facility] Counselor from 02/10/2022 in [facility]  Behavioral Medicine at [facility] Office Visit from 12/21/2021 in [facility] Primary [facility] Sports Medicine at [facility] Office Visit from 05/18/2021 in [facility] Primary Care & Sports Medicine at [facility]  PHQ-2 Total Score 2 3 1 2 4   PHQ-9 Total Score 16 17 14 5 20       SBQ-R    Flowsheet Row Counselor from 08/04/2022 in [facility] Behavioral Medicine at [facility]  SBQ-R Total Score 8      Flowsheet Row Office Visit from 09/20/2022 in [facility] at [facility]  C-SSRS RISK CATEGORY Error: Q3, 4, or 5 should not be populated when Q2 is No       Assessment and Plan: as follows  Reviewed prior documentation    Major depressive disorder recurrent moderate to severe; some better or same, not suicidal, he wants to continue prozac 20mg  and not increase. Continue therapy and distraction from negative thoughts He  wants to give 20mg  more time to work and will call earlier if needed  Generalized anxiety disorder; some better, decreased obsessions, continue prozac 20mg  and therapy  OCD; possible related to depression; some better intrusive thoughts. Continue prozac and therapy  Not suicidal,   Discussed distraction to add activities to work on having something positive to do and for distraction  Reviewed questions questions addressed medication reviewed follow-up 66month or earlier if needed in case symptoms are worsening or depression is worsening to call 911 for any suicidal thoughts or related behavior and to keep early appointment if needed for any worsening of symptoms  Direct care time spent 20  minutes with chart review, documentation elaboration and face-to-face Collaboration of Care: Primary Care Provider AEB notes and chart reviewed  Patient/Guardian was advised Release of Information must be obtained prior to any record release in order to collaborate their care with an outside  provider. Patient/Guardian was advised if they have not already done so to contact the registration department to sign all necessary forms in order for [place] to release information regarding their care.   Consent: Patient/Guardian gives verbal consent for treatment and assignment of benefits for services provided during this visit. Patient/Guardian expressed understanding and agreed to proceed.   Thresa Ross, MD 8/13/20241:53 PM

## 2022-10-27 ENCOUNTER — Ambulatory Visit: Payer: BC Managed Care – PPO | Admitting: Professional

## 2022-11-10 ENCOUNTER — Ambulatory Visit: Payer: BC Managed Care – PPO | Admitting: Professional

## 2022-11-15 ENCOUNTER — Ambulatory Visit (INDEPENDENT_AMBULATORY_CARE_PROVIDER_SITE_OTHER): Payer: BC Managed Care – PPO | Admitting: Professional

## 2022-11-15 ENCOUNTER — Ambulatory Visit (HOSPITAL_COMMUNITY): Payer: BC Managed Care – PPO | Admitting: Psychiatry

## 2022-11-15 ENCOUNTER — Encounter: Payer: Self-pay | Admitting: Professional

## 2022-11-15 DIAGNOSIS — F429 Obsessive-compulsive disorder, unspecified: Secondary | ICD-10-CM | POA: Diagnosis not present

## 2022-11-15 DIAGNOSIS — F321 Major depressive disorder, single episode, moderate: Secondary | ICD-10-CM
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
## 2022-11-23 ENCOUNTER — Encounter: Payer: Self-pay | Admitting: Professional

## 2022-11-23 ENCOUNTER — Ambulatory Visit (INDEPENDENT_AMBULATORY_CARE_PROVIDER_SITE_OTHER): Payer: BC Managed Care – PPO | Admitting: Professional

## 2022-11-23 DIAGNOSIS — F321 Major depressive disorder, single episode, moderate: Secondary | ICD-10-CM

## 2022-11-23 DIAGNOSIS — F429 Obsessive-compulsive disorder, unspecified: Secondary | ICD-10-CM

## 2022-11-23 NOTE — Progress Notes (Signed)
[facility] Counselor/Therapist Progress Note  Patient ID: Jerry Cisneros, MRN: [id number],    Date: 11/15/2022  Time Spent: 51 minutes [id number]  Treatment Type: Individual Therapy  Risk Assessment: Danger to Self:  No Self-injurious Behavior: No Danger to Others: No  Subjective: The patient arrived late for his in-person session.   Issues addressed: 1-personal -fixed the dryer at home -being more productive -he cleaned his room -took a week off with Covid 2-medication -he was unable to attend his appointment due to Covid -he stopped medication for a few days -Dr. Fredda Hammed suggested upping dose and he needs appointment to do so 3-relationship -had the chance to talk out his relationship for the first time -he and his friend were flirting -he introduced wanting a relationship and he feels glad that he has talked about it -he did not understand some of what "they" said -he thought it was pretty clear that if you say that you love me that seems like pretty clear dating -[name] said friends with benefits and the patient is comfortable with it -he is happy for having spoken to [name] and he feels good that the conversation was positive -pt and [name] are both looking to move and talked about it -pt is unsure of where he wants to live but thinks it would be somewhere he could feel more safe expressing himself, somewhere where he doesn't have to worry about how he might dress -[name] works PT at [employer], they live with their brother right now -pt thinks he wants to introduce talking over the phone    Treatment Plan Problems Addressed  Anxiety, Eating Disorders And Obesity, Family Conflict, Unipolar Depression Goals 1. Alleviate depressive symptoms and return to previous level of effective functioning. Objective Describe current and past experiences with depression including their impact on functioning and attempts to resolve it. Target Date: 2023-02-23 Frequency: biweekly   Progress: 100 Modality: individual  Related Interventions Encourage the client to share his/her thoughts and feelings of depression; express empathy and build rapport while identifying primary cognitive, behavioral, interpersonal, or other contributors to depression. Assess current and past mood episodes including their features, frequency, severity, and duration (e.g., clinical interview supplemented by the Inventory to Diagnose Depression). Objective Identify and replace thoughts and beliefs that support depression. Target Date: 2023-02-23 Frequency: biweekly  Progress: 70 Modality: individual  Related Interventions Conduct Cognitive-Behavioral Therapy (see Cognitive Behavior Therapy by Reola Calkins; Overcoming Depression by Agapito Games al.), beginning with helping the client learn the connection among cognition, depressive feelings, and actions. Assign the client to self-monitor thoughts, feelings, and actions in daily journal (e.g., "Negative Thoughts Trigger Negative Feelings" in the Adult Psychotherapy Homework Planner by Stephannie Li; "Daily Record of Dysfunctional Thoughts" in Cognitive Therapy of Depression by Ashby Dawes and Shea Evans); process the journal material to challenge depressive thinking patterns and replace them with reality-based thoughts. Assign "behavioral experiments" in which depressive automatic thoughts are treated as hypotheses/prediction, reality-based alternative hypotheses/prediction are generated, and both are tested against the client's past, present, and/or future experiences. Facilitate and reinforce the client's shift from biased depressive self-talk and beliefs to reality-based cognitive messages that enhance self-confidence and increase adaptive actions (see "Positive Self-Talk" in the Adult Psychotherapy Homework Planner by Stephannie Li). Objective Learn and implement behavioral strategies to overcome depression. Target Date: 2023-02-23 Frequency: biweekly  Progress: 40  Modality: individual  Related Interventions Engage the client in "behavioral activation," increasing his/her activity level and contact with sources of reward, while identifying processes that inhibit activation (see Behavioral Activation for  Depression by Katharine Look, Dimidjian, and Herman-Dunn; or assign "Identify and Schedule Pleasant Activities" in the Adult Psychotherapy Homework Planner by [facility]); use behavioral techniques such as instruction, rehearsal, role-playing, role reversal, as needed, to facilitate activity in the client's daily life; reinforce success. Assist the client in developing skills that increase the likelihood of deriving pleasure from behavioral activation (e.g., assertiveness skills, developing an exercise plan, less internal/more external focus, increased social involvement); reinforce success. Objective Verbalize an understanding and resolution of current interpersonal problems. Target Date: 2023-02-23 Frequency: biweekly  Progress: 60 Modality: individual  Related Interventions For role transitions (e.g., beginning or ending a relationship or career, moving, promotion, retirement, graduation), help the client mourn the loss of the old role while recognizing positive and negative aspects of the new role, and taking steps to gain mastery over the new role. Objective Learn and implement problem-solving and decision-making skills. Target Date: 2023-02-23 Frequency: biweekly  Progress: 40 Modality: individual  Objective Learn and implement conflict resolution skills to resolve interpersonal problems. Target Date: 2023-02-23 Frequency: biweekly  Progress: 50 Modality: individual  Related Interventions Teach conflict resolution skills (e.g., empathy, active listening, "I messages," respectful communication, assertiveness without aggression, compromise); use psychoeducation, modeling, role-playing, and rehearsal to work through several current conflicts; assign homework  exercises; review and repeat so as to integrate their use into the client's life. 2. Appropriately grieve the loss in order to normalize mood and to return to previously adaptive level of functioning. 3. Decrease the level of present conflict with parents while beginning to let go of or resolving past conflicts with them. Objective Describe the conflicts and the causes of conflicts between self and parents. Target Date: 2023-02-23 Frequency: biweekly  Progress: 80 Modality: individual  Related Interventions Give verbal permission for the client to have and express own feelings, thoughts, and perspectives in order to foster a sense of autonomy from family. Explore the nature of the client's family conflicts and their perceived causes. Objective Identify own as well as others' role in the family conflicts. Target Date: 2023-02-23 Frequency: biweekly  Progress: 40 Modality: individual  Related Interventions Confront the client when he/she is not taking responsibility for his/her role in the family conflict and reinforce the client for owning responsibility for his/her contribution to the conflict. Objective Increase the level of independent functioning. Target Date: 2023-02-23 Frequency: biweekly  Progress: 50 Modality: individual  Related Interventions Confront the client's emotional dependence and avoidance of economic responsibility that promotes continuing pattern of living with parents; develop a plan for the client's healthy and responsible emancipation from parents that is, if possible, complete with their blessing (e.g., finding and keeping a job, saving money, socializing with friends, finding own housing, etc.). Objective Report an increase in resolving conflicts with parents by talking calmly and assertively rather than aggressively and defensively. Target Date: 2023-02-23 Frequency: biweekly  Progress: 30 Modality: individual  Related Interventions Use role-playing, role reversal,  modeling, and behavioral rehearsal to help the client develop assertive ways to resolve conflict with parents (recommend Your Perfect Right: Assertiveness and Equality in Your Life and Relationships by Carnella Guadalajara). 4. Develop coping strategies (e.g., feeling identification, problem-solving, assertiveness) to address emotional issues that could lead to relapse of the eating disorder. 5. Develop healthy cognitive patterns and beliefs about self that lead to positive identity and prevent a relapse of the eating disorder. 6. Develop healthy interpersonal relationships that lead to alleviation and help prevent the relapse of the eating disorder. 7. Develop healthy interpersonal relationships that lead to  the alleviation and help prevent the relapse of depression. 8. Develop healthy thinking patterns and beliefs about self, others, and the world that lead to the alleviation and help prevent the relapse of depression. 9. Enhance ability to effectively cope with the full variety of life's worries and anxieties. 10. Learn and implement coping skills that result in a reduction of anxiety and worry, and improved daily functioning. 11. Reach a level of reduced tension, increased satisfaction, and improved communication with family and/or other authority figures. 12. Recognize, accept, and cope with feelings of depression. 13. Restore normal eating patterns, healthy weight maintenance, and a realistic appraisal of body size. Objective Honestly describe the pattern of eating including types, amounts, and frequency of food consumed or hoarded. Target Date: 2023-02-23 Frequency: biweekly  Progress: 70 Modality: individual  Related Interventions Assess the historical course of the disorder including the amount, type, and pattern of the client's food intake (e.g., too little food, too much food, binge eating, or hoarding food); perceived personal and interpersonal triggers and personal  goals. Objective Establish regular eating patterns by eating at regular intervals and consuming optimal daily calories. Target Date: 2023-02-23 Frequency: biweekly  Progress: 50 Modality: individual  Related Interventions Establish healthy weight goals for the client per the Body Mass Index (BMI), the [employer] Height and Weight Tables, or some other recognized standard. Objective Learn and implement skills for managing urges to engage in unhealthy eating or weight loss practices. Target Date: 2023-02-23 Frequency: biweekly  Progress: 50 Modality: individual  Related Interventions Teach the client tailored skills to manage high-risk situations including distraction, positive self-talk, problem-solving, conflict resolution (e.g., empathy, active listening, "I messages," respectful communication, assertiveness without aggression, compromise), or other social/ communication skills; use modeling, role-playing, and behavior rehearsal to work through several current situations. Objective State a basis for positive identity that is not based on weight and appearance but on character, traits, relationships, and intrinsic value. Target Date: 2023-02-23 Frequency: biweekly  Progress: 60 Modality: individual  Related Interventions Assist the client in identifying a basis for self-worth apart from body image by reviewing his/her talents, successes, positive traits, importance to others, and intrinsic spiritual value. Objective Verbalize an understanding of relapse prevention and the distinction between a lapse and a relapse. Target Date: 2023-02-23 Frequency: biweekly  Progress: 30 Modality: individual  Related Interventions Discuss with the client the distinction between a lapse and relapse, associating a lapse with an initial and reversible return of distress, urges, or to avoid, and relapse with the decision to return to the cycle of maladaptive thoughts and actions (e.g., feeling anxious, binging,  then purging). Identify with the client future situations or circumstances in which lapses could occur. 14. Stabilize anxiety level while increasing ability to function on a daily basis. Objective Learn and implement problem-solving strategies for realistically addressing worries. Target Date: 2023-02-23 Frequency: biweekly  Progress: 30 Modality: individual  Related Interventions Teach the client problem-solving strategies involving specifically defining a problem, generating options for addressing it, evaluating the pros and cons of each option, selecting and implementing an optional action, and reevaluating and refining the action (or assign "Applying Problem-Solving to Interpersonal Conflict" in the Adult Psychotherapy Homework Planner by Stephannie Li). Objective Identify, challenge, and replace biased, fearful self-talk with positive, realistic, and empowering self-talk. Target Date: 2023-02-23 Frequency: biweekly  Progress: 50 Modality: individual  Related Interventions Explore the client's schema and self-talk that mediate his/her fear response; assist him/her in challenging the biases; replace the distorted messages with reality-based alternatives and positive, realistic self-talk  that will increase his/her self-confidence in coping with irrational fears (see Cognitive Therapy of Anxiety Disorders by Laurence Slate). Assign the client a homework exercise in which he/she identifies fearful self-talk, identifies biases in the self-talk, generates alternatives, and tests through behavioral experiments (or assign "Negative Thoughts Trigger Negative Feelings" in the Adult Psychotherapy Homework Planner by [facility]); review and reinforce success, providing corrective feedback toward improvement. Objective Learn to accept limitations in life and commit to tolerating, rather than avoiding, unpleasant emotions while accomplishing meaningful goals. Target Date: 2023-02-23 Frequency: biweekly  Progress: 40  Modality: individual  Related Interventions Use techniques from Acceptance and Commitment Therapy to help client accept uncomfortable realities such as lack of complete control, imperfections, and uncertainty and tolerate unpleasant emotions and thoughts in order to accomplish value-consistent goals. Objective Learn and implement personal and interpersonal skills to reduce anxiety and improve interpersonal relationships. Target Date: 2023-02-23 Frequency: biweekly  Progress: 60 Modality: individual  Related Interventions Use instruction, modeling, and role-playing to build the client's general social, communication, and/or conflict resolution skills. Objective Maintain involvement in work, family, and social activities. Target Date: 2023-02-23 Frequency: biweekly  Progress: 60 Modality: individual  Related Interventions Support the client in following through with work, family, and social activities rather than escaping or avoiding them to focus on anxiety. 15. Terminate overeating and implement lifestyle changes that lead to weight loss and improved health. 16. Terminate the pattern of binge eating and purging behavior with a return to eating normal amounts of nutritious foods.  Diagnosis:MDD (major depressive disorder), single episode, moderate (HCC)  Obsessive-compulsive disorder, unspecified type  Plan:  -meet again on Thursday, December 08, 2022 at 3pm in-person

## 2022-11-24 ENCOUNTER — Ambulatory Visit: Payer: BC Managed Care – PPO | Admitting: Professional

## 2022-12-07 ENCOUNTER — Telehealth (HOSPITAL_COMMUNITY): Payer: BC Managed Care – PPO | Admitting: Psychiatry

## 2022-12-07 ENCOUNTER — Other Ambulatory Visit (HOSPITAL_COMMUNITY): Payer: Self-pay | Admitting: Psychiatry

## 2022-12-08 ENCOUNTER — Ambulatory Visit: Payer: BC Managed Care – PPO | Admitting: Professional

## 2022-12-12 ENCOUNTER — Ambulatory Visit (INDEPENDENT_AMBULATORY_CARE_PROVIDER_SITE_OTHER): Payer: BC Managed Care – PPO | Admitting: Professional

## 2022-12-12 ENCOUNTER — Encounter: Payer: Self-pay | Admitting: Professional

## 2022-12-12 DIAGNOSIS — F419 Anxiety disorder, unspecified: Secondary | ICD-10-CM | POA: Diagnosis not present

## 2022-12-12 DIAGNOSIS — F321 Major depressive disorder, single episode, moderate: Secondary | ICD-10-CM | POA: Diagnosis not present

## 2022-12-12 DIAGNOSIS — F429 Obsessive-compulsive disorder, unspecified: Secondary | ICD-10-CM

## 2022-12-12 NOTE — Progress Notes (Signed)
[facility] Counselor/Therapist Progress Note  Patient ID: Jerry Cisneros, MRN: [id number],    Date: 12/12/2022  Time Spent: 47 minutes [id number]  Subjective: This session was held via video teletherapy. The patient consented to video teletherapy and was located in his home during this session. He is aware it is the responsibility of the patient to secure confidentiality on his end of the session. The provider was in a private home office for the duration of this session.    The patient arrived on time for his Caregility appointment.  Issues addressed: 1-personal a-feeling stressed b-his family want to plan a ten day cruise with his family, his uncle's family, and her grandmother -pt cannot take off work for that period of time -he thinks he plans to leave anyway so quit in order to go on the cruise c-school -he contacted Dr. Leonette Most, dean, from [facility] he knows from church -Dr. Leonette Most is willing to help him get connected -he email dr Leonette Most who then forwarded to two colleagues -pt shared what he had learned from [employer] and that experience -challenged pt when he should request help from professor or admin d-father is very emotional and has had several "breakdowns" and has been more depressed and anxious -father expressed suicidality to the patient and patient didn't know what to do  Treatment Plan Problems Addressed  Anxiety, Eating Disorders And Obesity, Family Conflict, Unipolar Depression Goals 1. Alleviate depressive symptoms and return to previous level of effective functioning. Objective Describe current and past experiences with depression including their impact on functioning and attempts to resolve it. Target Date: 2023-02-23 Frequency: biweekly  Progress: 100 Modality: individual  Related Interventions Encourage the client to share his/her thoughts and feelings of depression; express empathy and build rapport while identifying primary cognitive,  behavioral, interpersonal, or other contributors to depression. Assess current and past mood episodes including their features, frequency, severity, and duration (e.g., clinical interview supplemented by the Inventory to Diagnose Depression). Objective Identify and replace thoughts and beliefs that support depression. Target Date: 2023-02-23 Frequency: biweekly  Progress: 70 Modality: individual  Related Interventions Conduct Cognitive-Behavioral Therapy (see Cognitive Behavior Therapy by Reola Calkins; Overcoming Depression by Agapito Games al.), beginning with helping the client learn the connection among cognition, depressive feelings, and actions. Assign the client to self-monitor thoughts, feelings, and actions in daily journal (e.g., "Negative Thoughts Trigger Negative Feelings" in the Adult Psychotherapy Homework Planner by Stephannie Li; "Daily Record of Dysfunctional Thoughts" in Cognitive Therapy of Depression by Ashby Dawes and Shea Evans); process the journal material to challenge depressive thinking patterns and replace them with reality-based thoughts. Assign "behavioral experiments" in which depressive automatic thoughts are treated as hypotheses/prediction, reality-based alternative hypotheses/prediction are generated, and both are tested against the client's past, present, and/or future experiences. Facilitate and reinforce the client's shift from biased depressive self-talk and beliefs to reality-based cognitive messages that enhance self-confidence and increase adaptive actions (see "Positive Self-Talk" in the Adult Psychotherapy Homework Planner by Stephannie Li). Objective Learn and implement behavioral strategies to overcome depression. Target Date: 2023-02-23 Frequency: biweekly  Progress: 40 Modality: individual  Related Interventions Engage the client in "behavioral activation," increasing his/her activity level and contact with sources of reward, while identifying processes that inhibit activation  (see Behavioral Activation for Depression by Katharine Look, Dimidjian, and Herman-Dunn; or assign "Identify and Schedule Pleasant Activities" in the Adult Psychotherapy Homework Planner by [facility]); use behavioral techniques such as instruction, rehearsal, role-playing, role reversal, as needed, to facilitate activity in the client's daily life; reinforce success.  Assist the client in developing skills that increase the likelihood of deriving pleasure from behavioral activation (e.g., assertiveness skills, developing an exercise plan, less internal/more external focus, increased social involvement); reinforce success. Objective Verbalize an understanding and resolution of current interpersonal problems. Target Date: 2023-02-23 Frequency: biweekly  Progress: 60 Modality: individual  Related Interventions For role transitions (e.g., beginning or ending a relationship or career, moving, promotion, retirement, graduation), help the client mourn the loss of the old role while recognizing positive and negative aspects of the new role, and taking steps to gain mastery over the new role. Objective Learn and implement problem-solving and decision-making skills. Target Date: 2023-02-23 Frequency: biweekly  Progress: 40 Modality: individual  Objective Learn and implement conflict resolution skills to resolve interpersonal problems. Target Date: 2023-02-23 Frequency: biweekly  Progress: 50 Modality: individual  Related Interventions Teach conflict resolution skills (e.g., empathy, active listening, "I messages," respectful communication, assertiveness without aggression, compromise); use psychoeducation, modeling, role-playing, and rehearsal to work through several current conflicts; assign homework exercises; review and repeat so as to integrate their use into the client's life. 2. Appropriately grieve the loss in order to normalize mood and to return to previously adaptive level of functioning. 3. Decrease the  level of present conflict with parents while beginning to let go of or resolving past conflicts with them. Objective Describe the conflicts and the causes of conflicts between self and parents. Target Date: 2023-02-23 Frequency: biweekly  Progress: 80 Modality: individual  Related Interventions Give verbal permission for the client to have and express own feelings, thoughts, and perspectives in order to foster a sense of autonomy from family. Explore the nature of the client's family conflicts and their perceived causes. Objective Identify own as well as others' role in the family conflicts. Target Date: 2023-02-23 Frequency: biweekly  Progress: 40 Modality: individual  Related Interventions Confront the client when he/she is not taking responsibility for his/her role in the family conflict and reinforce the client for owning responsibility for his/her contribution to the conflict. Objective Increase the level of independent functioning. Target Date: 2023-02-23 Frequency: biweekly  Progress: 50 Modality: individual  Related Interventions Confront the client's emotional dependence and avoidance of economic responsibility that promotes continuing pattern of living with parents; develop a plan for the client's healthy and responsible emancipation from parents that is, if possible, complete with their blessing (e.g., finding and keeping a job, saving money, socializing with friends, finding own housing, etc.). Objective Report an increase in resolving conflicts with parents by talking calmly and assertively rather than aggressively and defensively. Target Date: 2023-02-23 Frequency: biweekly  Progress: 30 Modality: individual  Related Interventions Use role-playing, role reversal, modeling, and behavioral rehearsal to help the client develop assertive ways to resolve conflict with parents (recommend Your Perfect Right: Assertiveness and Equality in Your Life and Relationships by Carnella Guadalajara). 4. Develop coping strategies (e.g., feeling identification, problem-solving, assertiveness) to address emotional issues that could lead to relapse of the eating disorder. 5. Develop healthy cognitive patterns and beliefs about self that lead to positive identity and prevent a relapse of the eating disorder. 6. Develop healthy interpersonal relationships that lead to alleviation and help prevent the relapse of the eating disorder. 7. Develop healthy interpersonal relationships that lead to the alleviation and help prevent the relapse of depression. 8. Develop healthy thinking patterns and beliefs about self, others, and the world that lead to the alleviation and help prevent the relapse of depression. 9. Enhance ability to effectively cope with the full  variety of life's worries and anxieties. 10. Learn and implement coping skills that result in a reduction of anxiety and worry, and improved daily functioning. 11. Reach a level of reduced tension, increased satisfaction, and improved communication with family and/or other authority figures. 12. Recognize, accept, and cope with feelings of depression. 13. Restore normal eating patterns, healthy weight maintenance, and a realistic appraisal of body size. Objective Honestly describe the pattern of eating including types, amounts, and frequency of food consumed or hoarded. Target Date: 2023-02-23 Frequency: biweekly  Progress: 70 Modality: individual  Related Interventions Assess the historical course of the disorder including the amount, type, and pattern of the client's food intake (e.g., too little food, too much food, binge eating, or hoarding food); perceived personal and interpersonal triggers and personal goals. Objective Establish regular eating patterns by eating at regular intervals and consuming optimal daily calories. Target Date: 2023-02-23 Frequency: biweekly  Progress: 50 Modality: individual  Related Interventions Establish  healthy weight goals for the client per the Body Mass Index (BMI), the [employer] Height and Weight Tables, or some other recognized standard. Objective Learn and implement skills for managing urges to engage in unhealthy eating or weight loss practices. Target Date: 2023-02-23 Frequency: biweekly  Progress: 50 Modality: individual  Related Interventions Teach the client tailored skills to manage high-risk situations including distraction, positive self-talk, problem-solving, conflict resolution (e.g., empathy, active listening, "I messages," respectful communication, assertiveness without aggression, compromise), or other social/ communication skills; use modeling, role-playing, and behavior rehearsal to work through several current situations. Objective State a basis for positive identity that is not based on weight and appearance but on character, traits, relationships, and intrinsic value. Target Date: 2023-02-23 Frequency: biweekly  Progress: 60 Modality: individual  Related Interventions Assist the client in identifying a basis for self-worth apart from body image by reviewing his/her talents, successes, positive traits, importance to others, and intrinsic spiritual value. Objective Verbalize an understanding of relapse prevention and the distinction between a lapse and a relapse. Target Date: 2023-02-23 Frequency: biweekly  Progress: 30 Modality: individual  Related Interventions Discuss with the client the distinction between a lapse and relapse, associating a lapse with an initial and reversible return of distress, urges, or to avoid, and relapse with the decision to return to the cycle of maladaptive thoughts and actions (e.g., feeling anxious, binging, then purging). Identify with the client future situations or circumstances in which lapses could occur. 14. Stabilize anxiety level while increasing ability to function on a daily basis. Objective Learn and implement problem-solving  strategies for realistically addressing worries. Target Date: 2023-02-23 Frequency: biweekly  Progress: 30 Modality: individual  Related Interventions Teach the client problem-solving strategies involving specifically defining a problem, generating options for addressing it, evaluating the pros and cons of each option, selecting and implementing an optional action, and reevaluating and refining the action (or assign "Applying Problem-Solving to Interpersonal Conflict" in the Adult Psychotherapy Homework Planner by Stephannie Li). Objective Identify, challenge, and replace biased, fearful self-talk with positive, realistic, and empowering self-talk. Target Date: 2023-02-23 Frequency: biweekly  Progress: 50 Modality: individual  Related Interventions Explore the client's schema and self-talk that mediate his/her fear response; assist him/her in challenging the biases; replace the distorted messages with reality-based alternatives and positive, realistic self-talk that will increase his/her self-confidence in coping with irrational fears (see Cognitive Therapy of Anxiety Disorders by Laurence Slate). Assign the client a homework exercise in which he/she identifies fearful self-talk, identifies biases in the self-talk, generates alternatives, and tests through behavioral  experiments (or assign "Negative Thoughts Trigger Negative Feelings" in the Adult Psychotherapy Homework Planner by [facility]); review and reinforce success, providing corrective feedback toward improvement. Objective Learn to accept limitations in life and commit to tolerating, rather than avoiding, unpleasant emotions while accomplishing meaningful goals. Target Date: 2023-02-23 Frequency: biweekly  Progress: 40 Modality: individual  Related Interventions Use techniques from Acceptance and Commitment Therapy to help client accept uncomfortable realities such as lack of complete control, imperfections, and uncertainty and tolerate unpleasant  emotions and thoughts in order to accomplish value-consistent goals. Objective Learn and implement personal and interpersonal skills to reduce anxiety and improve interpersonal relationships. Target Date: 2023-02-23 Frequency: biweekly  Progress: 60 Modality: individual  Related Interventions Use instruction, modeling, and role-playing to build the client's general social, communication, and/or conflict resolution skills. Objective Maintain involvement in work, family, and social activities. Target Date: 2023-02-23 Frequency: biweekly  Progress: 60 Modality: individual  Related Interventions Support the client in following through with work, family, and social activities rather than escaping or avoiding them to focus on anxiety. 15. Terminate overeating and implement lifestyle changes that lead to weight loss and improved health. 16. Terminate the pattern of binge eating and purging behavior with a return to eating normal amounts of nutritious foods.  Diagnosis:MDD (major depressive disorder), single episode, moderate (HCC)  Obsessive-compulsive disorder, unspecified type  Anxiety  Plan:  -meet again on Thursday, December 22, 2022 at 3pm in-person

## 2022-12-21 ENCOUNTER — Telehealth (HOSPITAL_COMMUNITY): Payer: BC Managed Care – PPO | Admitting: Psychiatry

## 2022-12-21 ENCOUNTER — Encounter (HOSPITAL_COMMUNITY): Payer: Self-pay

## 2022-12-21 NOTE — Progress Notes (Unsigned)
Audio video didn't connect from patient side, advised to schedule in office . Call back for concerns

## 2022-12-22 ENCOUNTER — Encounter: Payer: Self-pay | Admitting: Professional

## 2022-12-22 ENCOUNTER — Ambulatory Visit: Payer: BC Managed Care – PPO | Admitting: Professional

## 2022-12-22 DIAGNOSIS — F321 Major depressive disorder, single episode, moderate: Secondary | ICD-10-CM | POA: Diagnosis not present

## 2022-12-22 DIAGNOSIS — F429 Obsessive-compulsive disorder, unspecified: Secondary | ICD-10-CM | POA: Diagnosis not present

## 2022-12-22 DIAGNOSIS — F419 Anxiety disorder, unspecified: Secondary | ICD-10-CM | POA: Diagnosis not present

## 2022-12-22 NOTE — Progress Notes (Signed)
[facility] Counselor/Therapist Progress Note  Patient ID: Jerry Cisneros, MRN: [id number],    Date: 12/22/2022  Time Spent: 53 minutes [id number]  Subjective: This session was held via video teletherapy. The patient consented to video teletherapy and was located in his home during this session. He is aware it is the responsibility of the patient to secure confidentiality on his end of the session. The provider was in a private home office for the duration of this session.    The patient arrived on time for his Caregility appointment.  Issues addressed: 1-personal a-presents as overly talkative -admits to having had a large [employer] iced coffee drink and canned energy drink -he normally doesn't consume caffeine ad he was clearly hyped up  -has has been struggling with feeling anxious since yesterday but is unable to identify a trigger -he is having difficulty sitting still in session and this is unusual -he does not endorse and suicidal or homicidal thinking -he does feel anxious about getting into school and locating a PT job to work while in school   -pt has made strides in his quest to enter school in the fall   -he does need to research the financial aid availability b-sleep has been difficult -he doesn't have a sleep apnea machine since the apnea is considered mild -pt shared that part of his bedtime ritual includes masturbating but not for pleasure, more because it is his habit -he admits that he would like this to stop daily multiple times before bed masturbation and plans to try progressive muscle relaxation as an option -relationship -patient still spends time texting with bf, however, admits he tends to be flirtatious with many people -he shared having a nice group of friends in his polycule    Treatment Plan Problems Addressed  Anxiety, Eating Disorders And Obesity, Family Conflict, Unipolar Depression Goals 1. Alleviate depressive symptoms and return  to previous level of effective functioning. Objective Describe current and past experiences with depression including their impact on functioning and attempts to resolve it. Target Date: 2023-02-23 Frequency: biweekly  Progress: 100 Modality: individual  Related Interventions Encourage the client to share his/her thoughts and feelings of depression; express empathy and build rapport while identifying primary cognitive, behavioral, interpersonal, or other contributors to depression. Assess current and past mood episodes including their features, frequency, severity, and duration (e.g., clinical interview supplemented by the Inventory to Diagnose Depression). Objective Identify and replace thoughts and beliefs that support depression. Target Date: 2023-02-23 Frequency: biweekly  Progress: 70 Modality: individual  Related Interventions Conduct Cognitive-Behavioral Therapy (see Cognitive Behavior Therapy by Reola Calkins; Overcoming Depression by Agapito Games al.), beginning with helping the client learn the connection among cognition, depressive feelings, and actions. Assign the client to self-monitor thoughts, feelings, and actions in daily journal (e.g., "Negative Thoughts Trigger Negative Feelings" in the Adult Psychotherapy Homework Planner by Stephannie Li; "Daily Record of Dysfunctional Thoughts" in Cognitive Therapy of Depression by Ashby Dawes and Shea Evans); process the journal material to challenge depressive thinking patterns and replace them with reality-based thoughts. Assign "behavioral experiments" in which depressive automatic thoughts are treated as hypotheses/prediction, reality-based alternative hypotheses/prediction are generated, and both are tested against the client's past, present, and/or future experiences. Facilitate and reinforce the client's shift from biased depressive self-talk and beliefs to reality-based cognitive messages that enhance self-confidence and increase adaptive actions (see  "Positive Self-Talk" in the Adult Psychotherapy Homework Planner by Stephannie Li). Objective Learn and implement behavioral strategies to overcome depression. Target Date: 2023-02-23 Frequency: biweekly  Progress:  40 Modality: individual  Related Interventions Engage the client in "behavioral activation," increasing his/her activity level and contact with sources of reward, while identifying processes that inhibit activation (see Behavioral Activation for Depression by Katharine Look, Dimidjian, and Herman-Dunn; or assign "Identify and Schedule Pleasant Activities" in the Adult Psychotherapy Homework Planner by [facility]); use behavioral techniques such as instruction, rehearsal, role-playing, role reversal, as needed, to facilitate activity in the client's daily life; reinforce success. Assist the client in developing skills that increase the likelihood of deriving pleasure from behavioral activation (e.g., assertiveness skills, developing an exercise plan, less internal/more external focus, increased social involvement); reinforce success. Objective Verbalize an understanding and resolution of current interpersonal problems. Target Date: 2023-02-23 Frequency: biweekly  Progress: 60 Modality: individual  Related Interventions For role transitions (e.g., beginning or ending a relationship or career, moving, promotion, retirement, graduation), help the client mourn the loss of the old role while recognizing positive and negative aspects of the new role, and taking steps to gain mastery over the new role. Objective Learn and implement problem-solving and decision-making skills. Target Date: 2023-02-23 Frequency: biweekly  Progress: 40 Modality: individual  Objective Learn and implement conflict resolution skills to resolve interpersonal problems. Target Date: 2023-02-23 Frequency: biweekly  Progress: 50 Modality: individual  Related Interventions Teach conflict resolution skills (e.g., empathy, active  listening, "I messages," respectful communication, assertiveness without aggression, compromise); use psychoeducation, modeling, role-playing, and rehearsal to work through several current conflicts; assign homework exercises; review and repeat so as to integrate their use into the client's life. 2. Appropriately grieve the loss in order to normalize mood and to return to previously adaptive level of functioning. 3. Decrease the level of present conflict with parents while beginning to let go of or resolving past conflicts with them. Objective Describe the conflicts and the causes of conflicts between self and parents. Target Date: 2023-02-23 Frequency: biweekly  Progress: 80 Modality: individual  Related Interventions Give verbal permission for the client to have and express own feelings, thoughts, and perspectives in order to foster a sense of autonomy from family. Explore the nature of the client's family conflicts and their perceived causes. Objective Identify own as well as others' role in the family conflicts. Target Date: 2023-02-23 Frequency: biweekly  Progress: 40 Modality: individual  Related Interventions Confront the client when he/she is not taking responsibility for his/her role in the family conflict and reinforce the client for owning responsibility for his/her contribution to the conflict. Objective Increase the level of independent functioning. Target Date: 2023-02-23 Frequency: biweekly  Progress: 50 Modality: individual  Related Interventions Confront the client's emotional dependence and avoidance of economic responsibility that promotes continuing pattern of living with parents; develop a plan for the client's healthy and responsible emancipation from parents that is, if possible, complete with their blessing (e.g., finding and keeping a job, saving money, socializing with friends, finding own housing, etc.). Objective Report an increase in resolving conflicts with  parents by talking calmly and assertively rather than aggressively and defensively. Target Date: 2023-02-23 Frequency: biweekly  Progress: 30 Modality: individual  Related Interventions Use role-playing, role reversal, modeling, and behavioral rehearsal to help the client develop assertive ways to resolve conflict with parents (recommend Your Perfect Right: Assertiveness and Equality in Your Life and Relationships by Carnella Guadalajara). 4. Develop coping strategies (e.g., feeling identification, problem-solving, assertiveness) to address emotional issues that could lead to relapse of the eating disorder. 5. Develop healthy cognitive patterns and beliefs about self that lead to positive identity and prevent  a relapse of the eating disorder. 6. Develop healthy interpersonal relationships that lead to alleviation and help prevent the relapse of the eating disorder. 7. Develop healthy interpersonal relationships that lead to the alleviation and help prevent the relapse of depression. 8. Develop healthy thinking patterns and beliefs about self, others, and the world that lead to the alleviation and help prevent the relapse of depression. 9. Enhance ability to effectively cope with the full variety of life's worries and anxieties. 10. Learn and implement coping skills that result in a reduction of anxiety and worry, and improved daily functioning. 11. Reach a level of reduced tension, increased satisfaction, and improved communication with family and/or other authority figures. 12. Recognize, accept, and cope with feelings of depression. 13. Restore normal eating patterns, healthy weight maintenance, and a realistic appraisal of body size. Objective Honestly describe the pattern of eating including types, amounts, and frequency of food consumed or hoarded. Target Date: 2023-02-23 Frequency: biweekly  Progress: 70 Modality: individual  Related Interventions Assess the historical course of the disorder  including the amount, type, and pattern of the client's food intake (e.g., too little food, too much food, binge eating, or hoarding food); perceived personal and interpersonal triggers and personal goals. Objective Establish regular eating patterns by eating at regular intervals and consuming optimal daily calories. Target Date: 2023-02-23 Frequency: biweekly  Progress: 50 Modality: individual  Related Interventions Establish healthy weight goals for the client per the Body Mass Index (BMI), the [place] Height and Weight Tables, or some other recognized standard. Objective Learn and implement skills for managing urges to engage in unhealthy eating or weight loss practices. Target Date: 2023-02-23 Frequency: biweekly  Progress: 50 Modality: individual  Related Interventions Teach the client tailored skills to manage high-risk situations including distraction, positive self-talk, problem-solving, conflict resolution (e.g., empathy, active listening, "I messages," respectful communication, assertiveness without aggression, compromise), or other social/ communication skills; use modeling, role-playing, and behavior rehearsal to work through several current situations. Objective State a basis for positive identity that is not based on weight and appearance but on character, traits, relationships, and intrinsic value. Target Date: 2023-02-23 Frequency: biweekly  Progress: 60 Modality: individual  Related Interventions Assist the client in identifying a basis for self-worth apart from body image by reviewing his/her talents, successes, positive traits, importance to others, and intrinsic spiritual value. Objective Verbalize an understanding of relapse prevention and the distinction between a lapse and a relapse. Target Date: 2023-02-23 Frequency: biweekly  Progress: 30 Modality: individual  Related Interventions Discuss with the client the distinction between a lapse and relapse, associating a  lapse with an initial and reversible return of distress, urges, or to avoid, and relapse with the decision to return to the cycle of maladaptive thoughts and actions (e.g., feeling anxious, binging, then purging). Identify with the client future situations or circumstances in which lapses could occur. 14. Stabilize anxiety level while increasing ability to function on a daily basis. Objective Learn and implement problem-solving strategies for realistically addressing worries. Target Date: 2023-02-23 Frequency: biweekly  Progress: 30 Modality: individual  Related Interventions Teach the client problem-solving strategies involving specifically defining a problem, generating options for addressing it, evaluating the pros and cons of each option, selecting and implementing an optional action, and reevaluating and refining the action (or assign "Applying Problem-Solving to Interpersonal Conflict" in the Adult Psychotherapy Homework Planner by Stephannie Li). Objective Identify, challenge, and replace biased, fearful self-talk with positive, realistic, and empowering self-talk. Target Date: 2023-02-23 Frequency: biweekly  Progress: 50 Modality:  individual  Related Interventions Explore the client's schema and self-talk that mediate his/her fear response; assist him/her in challenging the biases; replace the distorted messages with reality-based alternatives and positive, realistic self-talk that will increase his/her self-confidence in coping with irrational fears (see Cognitive Therapy of Anxiety Disorders by Laurence Slate). Assign the client a homework exercise in which he/she identifies fearful self-talk, identifies biases in the self-talk, generates alternatives, and tests through behavioral experiments (or assign "Negative Thoughts Trigger Negative Feelings" in the Adult Psychotherapy Homework Planner by [facility]); review and reinforce success, providing corrective feedback toward  improvement. Objective Learn to accept limitations in life and commit to tolerating, rather than avoiding, unpleasant emotions while accomplishing meaningful goals. Target Date: 2023-02-23 Frequency: biweekly  Progress: 40 Modality: individual  Related Interventions Use techniques from Acceptance and Commitment Therapy to help client accept uncomfortable realities such as lack of complete control, imperfections, and uncertainty and tolerate unpleasant emotions and thoughts in order to accomplish value-consistent goals. Objective Learn and implement personal and interpersonal skills to reduce anxiety and improve interpersonal relationships. Target Date: 2023-02-23 Frequency: biweekly  Progress: 60 Modality: individual  Related Interventions Use instruction, modeling, and role-playing to build the client's general social, communication, and/or conflict resolution skills. Objective Maintain involvement in work, family, and social activities. Target Date: 2023-02-23 Frequency: biweekly  Progress: 60 Modality: individual  Related Interventions Support the client in following through with work, family, and social activities rather than escaping or avoiding them to focus on anxiety. 15. Terminate overeating and implement lifestyle changes that lead to weight loss and improved health. 16. Terminate the pattern of binge eating and purging behavior with a return to eating normal amounts of nutritious foods.  Diagnosis:MDD (major depressive disorder), single episode, moderate (HCC)  Obsessive-compulsive disorder, unspecified type  Anxiety  Plan:  -meet again on Thursday, January 05, 2023 at 3pm in-person

## 2023-01-03 ENCOUNTER — Ambulatory Visit (HOSPITAL_COMMUNITY): Payer: BC Managed Care – PPO | Admitting: Psychiatry

## 2023-01-05 ENCOUNTER — Ambulatory Visit: Payer: BC Managed Care – PPO | Admitting: Professional

## 2023-01-05 ENCOUNTER — Encounter: Payer: Self-pay | Admitting: Professional

## 2023-01-05 DIAGNOSIS — F321 Major depressive disorder, single episode, moderate: Secondary | ICD-10-CM | POA: Diagnosis not present

## 2023-01-05 DIAGNOSIS — F419 Anxiety disorder, unspecified: Secondary | ICD-10-CM | POA: Diagnosis not present

## 2023-01-05 DIAGNOSIS — F429 Obsessive-compulsive disorder, unspecified: Secondary | ICD-10-CM | POA: Diagnosis not present

## 2023-01-05 NOTE — Progress Notes (Signed)
[facility] Counselor/Therapist Progress Note  Patient ID: Jerry Cisneros, MRN: [id number],    Date: 01/05/2023  Time Spent: 59 minutes 3-359pm  Subjective: This session was held via video teletherapy. The patient consented to video teletherapy and was located in his home during this session. He is aware it is the responsibility of the patient to secure confidentiality on his end of the session. The provider was in a private home office for the duration of this session.    The patient arrived on time for his Caregility appointment.  Issues addressed: 1-personal a-eating has improved gradually b-issues with father -pt doesn't like the pressure put on him by his father -his father is becoming unhealthier with more expectations of pt -pt not in place where he can afford a change -discussed how pt might get more privacy to dissuade his father and mother into walking randomly to his room  Treatment Plan Problems Addressed  Anxiety, Eating Disorders And Obesity, Family Conflict, Unipolar Depression Goals 1. Alleviate depressive symptoms and return to previous level of effective functioning. Objective Describe current and past experiences with depression including their impact on functioning and attempts to resolve it. Target Date: 2023-02-23 Frequency: biweekly  Progress: 100 Modality: individual  Related Interventions Encourage the client to share his/her thoughts and feelings of depression; express empathy and build rapport while identifying primary cognitive, behavioral, interpersonal, or other contributors to depression. Assess current and past mood episodes including their features, frequency, severity, and duration (e.g., clinical interview supplemented by the Inventory to Diagnose Depression). Objective Identify and replace thoughts and beliefs that support depression. Target Date: 2023-02-23 Frequency: biweekly  Progress: 70 Modality: individual  Related  Interventions Conduct Cognitive-Behavioral Therapy (see Cognitive Behavior Therapy by Reola Calkins; Overcoming Depression by Agapito Games al.), beginning with helping the client learn the connection among cognition, depressive feelings, and actions. Assign the client to self-monitor thoughts, feelings, and actions in daily journal (e.g., "Negative Thoughts Trigger Negative Feelings" in the Adult Psychotherapy Homework Planner by Stephannie Li; "Daily Record of Dysfunctional Thoughts" in Cognitive Therapy of Depression by Ashby Dawes and Shea Evans); process the journal material to challenge depressive thinking patterns and replace them with reality-based thoughts. Assign "behavioral experiments" in which depressive automatic thoughts are treated as hypotheses/prediction, reality-based alternative hypotheses/prediction are generated, and both are tested against the client's past, present, and/or future experiences. Facilitate and reinforce the client's shift from biased depressive self-talk and beliefs to reality-based cognitive messages that enhance self-confidence and increase adaptive actions (see "Positive Self-Talk" in the Adult Psychotherapy Homework Planner by Stephannie Li). Objective Learn and implement behavioral strategies to overcome depression. Target Date: 2023-02-23 Frequency: biweekly  Progress: 40 Modality: individual  Related Interventions Engage the client in "behavioral activation," increasing his/her activity level and contact with sources of reward, while identifying processes that inhibit activation (see Behavioral Activation for Depression by Katharine Look, Dimidjian, and Herman-Dunn; or assign "Identify and Schedule Pleasant Activities" in the Adult Psychotherapy Homework Planner by [facility]); use behavioral techniques such as instruction, rehearsal, role-playing, role reversal, as needed, to facilitate activity in the client's daily life; reinforce success. Assist the client in developing skills that  increase the likelihood of deriving pleasure from behavioral activation (e.g., assertiveness skills, developing an exercise plan, less internal/more external focus, increased social involvement); reinforce success. Objective Verbalize an understanding and resolution of current interpersonal problems. Target Date: 2023-02-23 Frequency: biweekly  Progress: 60 Modality: individual  Related Interventions For role transitions (e.g., beginning or ending a relationship or career, moving, promotion, retirement, graduation), help the  client mourn the loss of the old role while recognizing positive and negative aspects of the new role, and taking steps to gain mastery over the new role. Objective Learn and implement problem-solving and decision-making skills. Target Date: 2023-02-23 Frequency: biweekly  Progress: 40 Modality: individual  Objective Learn and implement conflict resolution skills to resolve interpersonal problems. Target Date: 2023-02-23 Frequency: biweekly  Progress: 50 Modality: individual  Related Interventions Teach conflict resolution skills (e.g., empathy, active listening, "I messages," respectful communication, assertiveness without aggression, compromise); use psychoeducation, modeling, role-playing, and rehearsal to work through several current conflicts; assign homework exercises; review and repeat so as to integrate their use into the client's life. 2. Appropriately grieve the loss in order to normalize mood and to return to previously adaptive level of functioning. 3. Decrease the level of present conflict with parents while beginning to let go of or resolving past conflicts with them. Objective Describe the conflicts and the causes of conflicts between self and parents. Target Date: 2023-02-23 Frequency: biweekly  Progress: 80 Modality: individual  Related Interventions Give verbal permission for the client to have and express own feelings, thoughts, and perspectives in  order to foster a sense of autonomy from family. Explore the nature of the client's family conflicts and their perceived causes. Objective Identify own as well as others' role in the family conflicts. Target Date: 2023-02-23 Frequency: biweekly  Progress: 40 Modality: individual  Related Interventions Confront the client when he/she is not taking responsibility for his/her role in the family conflict and reinforce the client for owning responsibility for his/her contribution to the conflict. Objective Increase the level of independent functioning. Target Date: 2023-02-23 Frequency: biweekly  Progress: 50 Modality: individual  Related Interventions Confront the client's emotional dependence and avoidance of economic responsibility that promotes continuing pattern of living with parents; develop a plan for the client's healthy and responsible emancipation from parents that is, if possible, complete with their blessing (e.g., finding and keeping a job, saving money, socializing with friends, finding own housing, etc.). Objective Report an increase in resolving conflicts with parents by talking calmly and assertively rather than aggressively and defensively. Target Date: 2023-02-23 Frequency: biweekly  Progress: 30 Modality: individual  Related Interventions Use role-playing, role reversal, modeling, and behavioral rehearsal to help the client develop assertive ways to resolve conflict with parents (recommend Your Perfect Right: Assertiveness and Equality in Your Life and Relationships by Carnella Guadalajara). 4. Develop coping strategies (e.g., feeling identification, problem-solving, assertiveness) to address emotional issues that could lead to relapse of the eating disorder. 5. Develop healthy cognitive patterns and beliefs about self that lead to positive identity and prevent a relapse of the eating disorder. 6. Develop healthy interpersonal relationships that lead to alleviation and help  prevent the relapse of the eating disorder. 7. Develop healthy interpersonal relationships that lead to the alleviation and help prevent the relapse of depression. 8. Develop healthy thinking patterns and beliefs about self, others, and the world that lead to the alleviation and help prevent the relapse of depression. 9. Enhance ability to effectively cope with the full variety of life's worries and anxieties. 10. Learn and implement coping skills that result in a reduction of anxiety and worry, and improved daily functioning. 11. Reach a level of reduced tension, increased satisfaction, and improved communication with family and/or other authority figures. 12. Recognize, accept, and cope with feelings of depression. 13. Restore normal eating patterns, healthy weight maintenance, and a realistic appraisal of body size. Objective Honestly describe the pattern  of eating including types, amounts, and frequency of food consumed or hoarded. Target Date: 2023-02-23 Frequency: biweekly  Progress: 70 Modality: individual  Related Interventions Assess the historical course of the disorder including the amount, type, and pattern of the client's food intake (e.g., too little food, too much food, binge eating, or hoarding food); perceived personal and interpersonal triggers and personal goals. Objective Establish regular eating patterns by eating at regular intervals and consuming optimal daily calories. Target Date: 2023-02-23 Frequency: biweekly  Progress: 50 Modality: individual  Related Interventions Establish healthy weight goals for the client per the Body Mass Index (BMI), the [place] Height and Weight Tables, or some other recognized standard. Objective Learn and implement skills for managing urges to engage in unhealthy eating or weight loss practices. Target Date: 2023-02-23 Frequency: biweekly  Progress: 50 Modality: individual  Related Interventions Teach the client tailored skills to  manage high-risk situations including distraction, positive self-talk, problem-solving, conflict resolution (e.g., empathy, active listening, "I messages," respectful communication, assertiveness without aggression, compromise), or other social/ communication skills; use modeling, role-playing, and behavior rehearsal to work through several current situations. Objective State a basis for positive identity that is not based on weight and appearance but on character, traits, relationships, and intrinsic value. Target Date: 2023-02-23 Frequency: biweekly  Progress: 60 Modality: individual  Related Interventions Assist the client in identifying a basis for self-worth apart from body image by reviewing his/her talents, successes, positive traits, importance to others, and intrinsic spiritual value. Objective Verbalize an understanding of relapse prevention and the distinction between a lapse and a relapse. Target Date: 2023-02-23 Frequency: biweekly  Progress: 30 Modality: individual  Related Interventions Discuss with the client the distinction between a lapse and relapse, associating a lapse with an initial and reversible return of distress, urges, or to avoid, and relapse with the decision to return to the cycle of maladaptive thoughts and actions (e.g., feeling anxious, binging, then purging). Identify with the client future situations or circumstances in which lapses could occur. 14. Stabilize anxiety level while increasing ability to function on a daily basis. Objective Learn and implement problem-solving strategies for realistically addressing worries. Target Date: 2023-02-23 Frequency: biweekly  Progress: 30 Modality: individual  Related Interventions Teach the client problem-solving strategies involving specifically defining a problem, generating options for addressing it, evaluating the pros and cons of each option, selecting and implementing an optional action, and reevaluating and refining  the action (or assign "Applying Problem-Solving to Interpersonal Conflict" in the Adult Psychotherapy Homework Planner by Stephannie Li). Objective Identify, challenge, and replace biased, fearful self-talk with positive, realistic, and empowering self-talk. Target Date: 2023-02-23 Frequency: biweekly  Progress: 50 Modality: individual  Related Interventions Explore the client's schema and self-talk that mediate his/her fear response; assist him/her in challenging the biases; replace the distorted messages with reality-based alternatives and positive, realistic self-talk that will increase his/her self-confidence in coping with irrational fears (see Cognitive Therapy of Anxiety Disorders by Laurence Slate). Assign the client a homework exercise in which he/she identifies fearful self-talk, identifies biases in the self-talk, generates alternatives, and tests through behavioral experiments (or assign "Negative Thoughts Trigger Negative Feelings" in the Adult Psychotherapy Homework Planner by [facility]); review and reinforce success, providing corrective feedback toward improvement. Objective Learn to accept limitations in life and commit to tolerating, rather than avoiding, unpleasant emotions while accomplishing meaningful goals. Target Date: 2023-02-23 Frequency: biweekly  Progress: 40 Modality: individual  Related Interventions Use techniques from Acceptance and Commitment Therapy to help client accept uncomfortable realities such  as lack of complete control, imperfections, and uncertainty and tolerate unpleasant emotions and thoughts in order to accomplish value-consistent goals. Objective Learn and implement personal and interpersonal skills to reduce anxiety and improve interpersonal relationships. Target Date: 2023-02-23 Frequency: biweekly  Progress: 60 Modality: individual  Related Interventions Use instruction, modeling, and role-playing to build the client's general social, communication, and/or  conflict resolution skills. Objective Maintain involvement in work, family, and social activities. Target Date: 2023-02-23 Frequency: biweekly  Progress: 60 Modality: individual  Related Interventions Support the client in following through with work, family, and social activities rather than escaping or avoiding them to focus on anxiety. 15. Terminate overeating and implement lifestyle changes that lead to weight loss and improved health. 16. Terminate the pattern of binge eating and purging behavior with a return to eating normal amounts of nutritious foods.  Diagnosis:MDD (major depressive disorder), single episode, moderate (HCC)  Obsessive-compulsive disorder, unspecified type  Anxiety  Plan:  -meet again on Thursday, January 19, 2023 at 3pm in-person

## 2023-01-19 ENCOUNTER — Ambulatory Visit: Payer: BC Managed Care – PPO | Admitting: Professional

## 2023-01-26 ENCOUNTER — Other Ambulatory Visit (HOSPITAL_COMMUNITY): Payer: Self-pay | Admitting: Psychiatry

## 2023-01-26 ENCOUNTER — Telehealth: Payer: Self-pay | Admitting: Family Medicine

## 2023-01-26 NOTE — Telephone Encounter (Signed)
Prescription Request  01/26/2023  LOV: Visit date not found  What is the name of the medication or equipment? FLUoxetine (PROZAC) 20 MG capsule   Have you contacted your pharmacy to request a refill? Yes   Which pharmacy would you like this sent to?  [facility] [address], [place] - [address] [place] [address] Phone: [phone] Fax: [phone]    Patient notified that their request is being sent to the clinical staff for review and that they should receive a response within 2 business days.   Please advise at [facility] [phone]

## 2023-01-28 NOTE — Telephone Encounter (Signed)
I do not see a discharge summary in the record. Can check faxes on Monday.

## 2023-01-30 NOTE — Telephone Encounter (Signed)
Spoke with hew stated he has a appt on 01/31/23 & will hold off getting any refills incase of any med changes

## 2023-01-31 ENCOUNTER — Encounter (HOSPITAL_COMMUNITY): Payer: Self-pay | Admitting: Psychiatry

## 2023-01-31 ENCOUNTER — Ambulatory Visit (HOSPITAL_COMMUNITY): Payer: BC Managed Care – PPO | Admitting: Psychiatry

## 2023-01-31 VITALS — BP 137/86 | HR 68 | Ht 68.0 in | Wt 243.0 lb

## 2023-01-31 DIAGNOSIS — F419 Anxiety disorder, unspecified: Secondary | ICD-10-CM | POA: Diagnosis not present

## 2023-01-31 DIAGNOSIS — F321 Major depressive disorder, single episode, moderate: Secondary | ICD-10-CM

## 2023-01-31 DIAGNOSIS — F429 Obsessive-compulsive disorder, unspecified: Secondary | ICD-10-CM | POA: Diagnosis not present

## 2023-01-31 MED ORDER — FLUOXETINE HCL 20 MG PO CAPS: 20.0000 mg | ORAL_CAPSULE | Freq: Every day | ORAL | 1 refills | Status: DC

## 2023-01-31 MED ORDER — FLUOXETINE HCL 10 MG PO CAPS: 10.0000 mg | ORAL_CAPSULE | Freq: Every day | ORAL | 1 refills | Status: DC

## 2023-01-31 NOTE — Progress Notes (Signed)
[facility] Follow up visit  Patient Identification: Jerry Cisneros MRN:  [id number] Date of Evaluation:  01/31/2023 Referral Source: primary care Chief Complaint:   Chief Complaint  Patient presents with   Follow-up  Depression  Visit Diagnosis:    ICD-10-CM   1. MDD (major depressive disorder), single episode, moderate (HCC)  F32.1     2. Obsessive-compulsive disorder, unspecified type  F42.9     3. Anxiety  F41.9       History of Present Illness: Patient is a 22 years old white male living with his parents and younger brother initially referred by primary care physician to establish care he is seeing therapist Teofilo Pod referred for assessment for of depression and anxiety and OCD.  Currently is not on any medication he works at a warehouse  Patient gives a history of depression starting around age 12  Also difficult growing up with the parents were strict controlling and religious Has history of  having sexual intrusive thoughts and harming toughts but says would not act on it, also had binge eating  Prozac was increased to 20mg  , some better but still feels subdued Planning to do college or community college but for now working    There is no clear manic symptoms   Aggravating factors:  difficult growing up  , religious and strict parents, obsessive thoughts of eating,, lonliness Modifying factors: online chats ,online friends  Duration since age 67 Severity gets subdued  Prior psych admission denies Suicide attempt denies  Drug or alcohol use denies  Past Psychiatric History: depression, anxiety   Previous Psychotropic Medications:   Substance Abuse History in the last 12 months:  No.  Consequences of Substance Abuse: NA  Past Medical History: History reviewed. No pertinent past medical history.  Past Surgical History:  Procedure Laterality Date   NO PAST SURGERIES      Family Psychiatric History: dad questionable: mom possible depression, one uncle many  hospital admissions. Diagnosis not known  Family History:  Family History  Problem Relation Age of Onset   Psoriasis Father     Social History:   Social History   Socioeconomic History   Marital status: Single    Spouse name: Not on file   Number of children: Not on file   Years of education: Not on file   Highest education level: Not on file  Occupational History   Occupation: Student  Tobacco Use   Smoking status: Never   Smokeless tobacco: Never  Substance and Sexual Activity   Alcohol use: Not on file   Drug use: Not on file   Sexual activity: Not on file  Other Topics Concern   Not on file  Social History Narrative   Mother is from [place] originally.    Social Determinants of Health   Financial Resource Strain: Not on file  Food Insecurity: Not on file  Transportation Needs: Not on file  Physical Activity: Not on file  Stress: Not on file  Social Connections: Not on file    Additional Social History: grew up with parents and young sibling brother. Strict controlling family, reglegious, had to go to church everything was sin if watch tv or earthly related. Difficult growing up  Allergies:   Allergies  Allergen Reactions   Erythromycin     Upset stomach    Metabolic Disorder Labs: No results found for: "HGBA1C", "MPG" No results found for: "PROLACTIN" Lab Results  Component Value Date   CHOL 162 12/31/2021   TRIG 54 12/31/2021  HDL 51 12/31/2021   CHOLHDL 3.2 12/31/2021   LDLCALC 97 12/31/2021   LDLCALC 81 01/10/2019   No results found for: "TSH"  Therapeutic Level Labs: No results found for: "LITHIUM" No results found for: "CBMZ" No results found for: "VALPROATE"  Current Medications: Current Outpatient Medications  Medication Sig Dispense Refill   FLUoxetine (PROZAC) 10 MG capsule Take 1 capsule (10 mg total) by mouth daily. 30 capsule 1   FLUoxetine (PROZAC) 20 MG capsule Take 1 capsule (20 mg total) by mouth daily. 30 capsule 1   No  current facility-administered medications for this visit.     Psychiatric Specialty Exam: Review of Systems  Cardiovascular:  Negative for chest pain.  Neurological:  Negative for tremors.  Psychiatric/Behavioral:  Positive for dysphoric mood. Negative for agitation.     Blood pressure 137/86, pulse 68, height 5\' 8"  (1.727 m), weight 243 lb (110.2 kg).Body mass index is 36.95 kg/m.  General Appearance: Casual  Eye Contact:  Fair  Speech:  Clear and Coherent  Volume:  Normal  Mood:  subdued  Affect:  Constricted  Thought Process:  Goal Directed  Orientation:  Full (Time, Place, and Person)  Thought Content:  Obsessions and Rumination  Suicidal Thoughts:  No  Homicidal Thoughts:  No  Memory:  Immediate;   Fair  Judgement:  Fair  Insight:  Shallow  Psychomotor Activity:  Normal  Concentration:  Concentration: Fair  Recall:  [employer] of Knowledge:Good  Language: Fair  Akathisia:  No  Handed:    AIMS (if indicated):  not done  Assets:  Desire for Improvement Housing Physical Health  ADL's:  Intact  Cognition: WNL  Sleep:  Fair   Screenings: GAD-7    Advertising copywriter from 11/15/2022 in [facility] Behavioral Medicine at [employer] Counselor from 08/04/2022 in [facility] Behavioral Medicine at [employer] Counselor from 02/10/2022 in [facility]al Medicine at [facility] Office Visit from 05/18/2021 in [facility] at [facility] Office Visit from 01/14/2019 in [facility] Primary Care & Sports Medicine at [facility]  Total GAD-7 Score 10 8 9 10 4       PHQ2-9    Flowsheet Row Counselor from 11/23/2022 in [facility] Behavioral Medicine at [facility] Office Visit from 09/20/2022 in [facility] at [facility] Counselor from 08/04/2022 in [facility] Behavioral Medicine at [facility] Counselor from 02/10/2022 in [facility] Behavioral Medicine at [facility] Office Visit from 12/21/2021 in [facility] Primary Care & Sports Medicine at [facility]  PHQ-2 Total Score 1 2 3 1 2   PHQ-9 Total Score -- 16 17 14 5       SBQ-R    Flowsheet Row Counselor from 08/04/2022 in [facility] Behavioral Medicine at [facility]  SBQ-R Total Score 8      Flowsheet Row Office Visit from 09/20/2022 in [facility] at [facility]  C-SSRS RISK CATEGORY Error: Q3, 4, or 5 should not be populated when Q2 is No       Assessment and Plan: as follows    Prior documentation reviewed   Major depressive disorder recurrent moderate to severe;  gets subdued, discussed to increase med he requested  Will increase to 20 plus 10mg  , continue therapy  Generalized anxiety disorder; fluctuates, increase prozac to 30mg    OCD; possible related to depression; still gets intrusive thoughts to eat or  subdued feeling of low self esteem. Continue therapy and will increase prozac to 30mg   Not suicidal,   Discussed distraction to add activities to work on having something positive to do and for distraction Fu 33m. He wants to return in 30m. Understands he should call earlier if needed  Direct care time spent 20 minutes with chart review, documentation elaboration and face-to-face Collaboration of Care: Primary Care Provider AEB notes and chart reviewed  Patient/Guardian was advised Release of Information must be obtained prior to any record release in order to collaborate their care with an outside provider. Patient/Guardian was advised if they have not already done so to contact the registration department to sign all necessary forms in order for [place] to release information regarding their care.   Consent: Patient/Guardian gives verbal consent for treatment and assignment of benefits for services provided  during this visit. Patient/Guardian expressed understanding and agreed to proceed.   Thresa Ross, MD 11/26/20243:27 PM

## 2023-02-01 NOTE — Telephone Encounter (Signed)
Last seen by psychiatry who feels this medication for him.  Will close note.

## 2023-02-16 ENCOUNTER — Ambulatory Visit: Payer: BC Managed Care – PPO | Admitting: Professional

## 2023-02-16 ENCOUNTER — Encounter: Payer: Self-pay | Admitting: Professional

## 2023-02-16 DIAGNOSIS — F321 Major depressive disorder, single episode, moderate: Secondary | ICD-10-CM

## 2023-02-16 DIAGNOSIS — F429 Obsessive-compulsive disorder, unspecified: Secondary | ICD-10-CM | POA: Diagnosis not present

## 2023-02-16 DIAGNOSIS — F419 Anxiety disorder, unspecified: Secondary | ICD-10-CM | POA: Diagnosis not present

## 2023-02-16 NOTE — Progress Notes (Signed)
[facility] Counselor/Therapist Progress Note  Patient ID: Jerry Cisneros, MRN: [id number],    Date: 02/16/2023  Time Spent: 59 minutes [id number]  Subjective: This session was held via video teletherapy. The patient consented to video teletherapy and was located in his home during this session. He is aware it is the responsibility of the patient to secure confidentiality on his end of the session. The provider was in a private home office for the duration of this session.    The patient arrived on time for his Caregility appointment.  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-school -taking 15-18 credit hours -pt nervous about medication -he worries about being lonely 4-mood -yesterday was irritable and recognized that he was hungry -he has noticed increased binging behaviors -he is experiencing guilt related to eating -he feels great stress going to the grocery 5-relationship with Myna Bright (not his given name) -has spent time planning with his partner when they meet each other -they are both pretty depressed and the pt is trying to      02/16/2023    3:53 PM 11/23/2022    3:10 PM 09/20/2022    2:11 PM  Depression screen PHQ 2/9  Decreased Interest 0 1   Down, Depressed, Hopeless 1    PHQ - 2 Score 1 1   Altered sleeping 1    Tired, decreased energy 3    Change in appetite 3    Feeling bad or failure about yourself  1    Trouble concentrating 1    Moving slowly or fidgety/restless 1    Suicidal thoughts 0    PHQ-9 Score 11    Difficult doing work/chores Somewhat difficult       Information is confidential and restricted. Go to Review Flowsheets to unlock data.   Treatment Plan Problems Addressed  Anxiety, Eating Disorders And Obesity, Family Conflict, Unipolar Depression Goals 1.  Alleviate depressive symptoms and return to previous level of effective functioning. Objective Describe current and past experiences with depression including their impact on functioning and attempts to resolve it. Target Date: 2023-02-23 Frequency: biweekly  Progress: 100 Modality: individual  Related Interventions Encourage the client to share his/her thoughts and feelings of depression; express empathy and build rapport while identifying primary cognitive, behavioral, interpersonal, or other contributors to depression. Assess current and past mood episodes including their features, frequency, severity, and duration (e.g., clinical interview supplemented by the Inventory to Diagnose Depression). Objective Identify and replace thoughts and beliefs that support depression. Target Date: 2023-02-23 Frequency: biweekly  Progress: 70 Modality: individual  Related Interventions Conduct Cognitive-Behavioral Therapy (see Cognitive Behavior Therapy by Reola Calkins; Overcoming Depression by Agapito Games al.), beginning with helping the client learn the connection among cognition, depressive feelings, and actions. Assign the client to self-monitor thoughts, feelings, and actions in daily journal (e.g., "Negative Thoughts Trigger Negative Feelings" in the Adult Psychotherapy Homework Planner by Stephannie Li; "Daily Record of Dysfunctional Thoughts" in Cognitive Therapy of Depression by Ashby Dawes and Shea Evans); process the journal material to challenge depressive thinking patterns and replace them with reality-based thoughts. Assign "behavioral experiments" in which depressive automatic thoughts are treated as hypotheses/prediction, reality-based alternative hypotheses/prediction are generated, and both are tested against the client's past, present, and/or future experiences. Facilitate and reinforce the client's shift from biased depressive self-talk and beliefs to reality-based cognitive messages  that enhance  self-confidence and increase adaptive actions (see "Positive Self-Talk" in the Adult Psychotherapy Homework Planner by Stephannie Li). Objective Learn and implement behavioral strategies to overcome depression. Target Date: 2023-02-23 Frequency: biweekly  Progress: 40 Modality: individual  Related Interventions Engage the client in "behavioral activation," increasing his/her activity level and contact with sources of reward, while identifying processes that inhibit activation (see Behavioral Activation for Depression by Katharine Look, Dimidjian, and Herman-Dunn; or assign "Identify and Schedule Pleasant Activities" in the Adult Psychotherapy Homework Planner by [facility]); use behavioral techniques such as instruction, rehearsal, role-playing, role reversal, as needed, to facilitate activity in the client's daily life; reinforce success. Assist the client in developing skills that increase the likelihood of deriving pleasure from behavioral activation (e.g., assertiveness skills, developing an exercise plan, less internal/more external focus, increased social involvement); reinforce success. Objective Verbalize an understanding and resolution of current interpersonal problems. Target Date: 2023-02-23 Frequency: biweekly  Progress: 60 Modality: individual  Related Interventions For role transitions (e.g., beginning or ending a relationship or career, moving, promotion, retirement, graduation), help the client mourn the loss of the old role while recognizing positive and negative aspects of the new role, and taking steps to gain mastery over the new role. Objective Learn and implement problem-solving and decision-making skills. Target Date: 2023-02-23 Frequency: biweekly  Progress: 40 Modality: individual  Objective Learn and implement conflict resolution skills to resolve interpersonal problems. Target Date: 2023-02-23 Frequency: biweekly  Progress: 50 Modality: individual  Related Interventions Teach  conflict resolution skills (e.g., empathy, active listening, "I messages," respectful communication, assertiveness without aggression, compromise); use psychoeducation, modeling, role-playing, and rehearsal to work through several current conflicts; assign homework exercises; review and repeat so as to integrate their use into the client's life. 2. Appropriately grieve the loss in order to normalize mood and to return to previously adaptive level of functioning. 3. Decrease the level of present conflict with parents while beginning to let go of or resolving past conflicts with them. Objective Describe the conflicts and the causes of conflicts between self and parents. Target Date: 2023-02-23 Frequency: biweekly  Progress: 80 Modality: individual  Related Interventions Give verbal permission for the client to have and express own feelings, thoughts, and perspectives in order to foster a sense of autonomy from family. Explore the nature of the client's family conflicts and their perceived causes. Objective Identify own as well as others' role in the family conflicts. Target Date: 2023-02-23 Frequency: biweekly  Progress: 40 Modality: individual  Related Interventions Confront the client when he/she is not taking responsibility for his/her role in the family conflict and reinforce the client for owning responsibility for his/her contribution to the conflict. Objective Increase the level of independent functioning. Target Date: 2023-02-23 Frequency: biweekly  Progress: 50 Modality: individual  Related Interventions Confront the client's emotional dependence and avoidance of economic responsibility that promotes continuing pattern of living with parents; develop a plan for the client's healthy and responsible emancipation from parents that is, if possible, complete with their blessing (e.g., finding and keeping a job, saving money, socializing with friends, finding own housing,  etc.). Objective Report an increase in resolving conflicts with parents by talking calmly and assertively rather than aggressively and defensively. Target Date: 2023-02-23 Frequency: biweekly  Progress: 30 Modality: individual  Related Interventions Use role-playing, role reversal, modeling, and behavioral rehearsal to help the client develop assertive ways to resolve conflict with parents (recommend Your Perfect Right: Assertiveness and Equality in Your Life and Relationships by Carnella Guadalajara). 4. Develop coping  strategies (e.g., feeling identification, problem-solving, assertiveness) to address emotional issues that could lead to relapse of the eating disorder. 5. Develop healthy cognitive patterns and beliefs about self that lead to positive identity and prevent a relapse of the eating disorder. 6. Develop healthy interpersonal relationships that lead to alleviation and help prevent the relapse of the eating disorder. 7. Develop healthy interpersonal relationships that lead to the alleviation and help prevent the relapse of depression. 8. Develop healthy thinking patterns and beliefs about self, others, and the world that lead to the alleviation and help prevent the relapse of depression. 9. Enhance ability to effectively cope with the full variety of life's worries and anxieties. 10. Learn and implement coping skills that result in a reduction of anxiety and worry, and improved daily functioning. 11. Reach a level of reduced tension, increased satisfaction, and improved communication with family and/or other authority figures. 12. Recognize, accept, and cope with feelings of depression. 13. Restore normal eating patterns, healthy weight maintenance, and a realistic appraisal of body size. Objective Honestly describe the pattern of eating including types, amounts, and frequency of food consumed or hoarded. Target Date: 2023-02-23 Frequency: biweekly  Progress: 70 Modality: individual   Related Interventions Assess the historical course of the disorder including the amount, type, and pattern of the client's food intake (e.g., too little food, too much food, binge eating, or hoarding food); perceived personal and interpersonal triggers and personal goals. Objective Establish regular eating patterns by eating at regular intervals and consuming optimal daily calories. Target Date: 2023-02-23 Frequency: biweekly  Progress: 50 Modality: individual  Related Interventions Establish healthy weight goals for the client per the Body Mass Index (BMI), the [place] Height and Weight Tables, or some other recognized standard. Objective Learn and implement skills for managing urges to engage in unhealthy eating or weight loss practices. Target Date: 2023-02-23 Frequency: biweekly  Progress: 50 Modality: individual  Related Interventions Teach the client tailored skills to manage high-risk situations including distraction, positive self-talk, problem-solving, conflict resolution (e.g., empathy, active listening, "I messages," respectful communication, assertiveness without aggression, compromise), or other social/ communication skills; use modeling, role-playing, and behavior rehearsal to work through several current situations. Objective State a basis for positive identity that is not based on weight and appearance but on character, traits, relationships, and intrinsic value. Target Date: 2023-02-23 Frequency: biweekly  Progress: 60 Modality: individual  Related Interventions Assist the client in identifying a basis for self-worth apart from body image by reviewing his/her talents, successes, positive traits, importance to others, and intrinsic spiritual value. Objective Verbalize an understanding of relapse prevention and the distinction between a lapse and a relapse. Target Date: 2023-02-23 Frequency: biweekly  Progress: 30 Modality: individual  Related Interventions Discuss with  the client the distinction between a lapse and relapse, associating a lapse with an initial and reversible return of distress, urges, or to avoid, and relapse with the decision to return to the cycle of maladaptive thoughts and actions (e.g., feeling anxious, binging, then purging). Identify with the client future situations or circumstances in which lapses could occur. 14. Stabilize anxiety level while increasing ability to function on a daily basis. Objective Learn and implement problem-solving strategies for realistically addressing worries. Target Date: 2023-02-23 Frequency: biweekly  Progress: 30 Modality: individual  Related Interventions Teach the client problem-solving strategies involving specifically defining a problem, generating options for addressing it, evaluating the pros and cons of each option, selecting and implementing an optional action, and reevaluating and refining the action (or assign "Applying  Problem-Solving to Interpersonal Conflict" in the Adult Psychotherapy Homework Planner by Stephannie Li). Objective Identify, challenge, and replace biased, fearful self-talk with positive, realistic, and empowering self-talk. Target Date: 2023-02-23 Frequency: biweekly  Progress: 50 Modality: individual  Related Interventions Explore the client's schema and self-talk that mediate his/her fear response; assist him/her in challenging the biases; replace the distorted messages with reality-based alternatives and positive, realistic self-talk that will increase his/her self-confidence in coping with irrational fears (see Cognitive Therapy of Anxiety Disorders by Laurence Slate). Assign the client a homework exercise in which he/she identifies fearful self-talk, identifies biases in the self-talk, generates alternatives, and tests through behavioral experiments (or assign "Negative Thoughts Trigger Negative Feelings" in the Adult Psychotherapy Homework Planner by [facility]); review and reinforce  success, providing corrective feedback toward improvement. Objective Learn to accept limitations in life and commit to tolerating, rather than avoiding, unpleasant emotions while accomplishing meaningful goals. Target Date: 2023-02-23 Frequency: biweekly  Progress: 40 Modality: individual  Related Interventions Use techniques from Acceptance and Commitment Therapy to help client accept uncomfortable realities such as lack of complete control, imperfections, and uncertainty and tolerate unpleasant emotions and thoughts in order to accomplish value-consistent goals. Objective Learn and implement personal and interpersonal skills to reduce anxiety and improve interpersonal relationships. Target Date: 2023-02-23 Frequency: biweekly  Progress: 60 Modality: individual  Related Interventions Use instruction, modeling, and role-playing to build the client's general social, communication, and/or conflict resolution skills. Objective Maintain involvement in work, family, and social activities. Target Date: 2023-02-23 Frequency: biweekly  Progress: 60 Modality: individual  Related Interventions Support the client in following through with work, family, and social activities rather than escaping or avoiding them to focus on anxiety. 15. Terminate overeating and implement lifestyle changes that lead to weight loss and improved health. 16. Terminate the pattern of binge eating and purging behavior with a return to eating normal amounts of nutritious foods.  Diagnosis:MDD (major depressive disorder), single episode, moderate (HCC)  Obsessive-compulsive disorder, unspecified type  Anxiety  Plan:  -meet again on Thursday, March 30, 2023 at [place] in-person

## 2023-02-21 ENCOUNTER — Telehealth: Payer: Self-pay

## 2023-02-21 ENCOUNTER — Ambulatory Visit: Payer: BC Managed Care – PPO | Admitting: Family Medicine

## 2023-02-21 VITALS — BP 131/66 | HR 100 | Temp 98.6°F | Ht 68.0 in | Wt 239.0 lb

## 2023-02-21 DIAGNOSIS — R5383 Other fatigue: Secondary | ICD-10-CM | POA: Diagnosis not present

## 2023-02-21 DIAGNOSIS — R0602 Shortness of breath: Secondary | ICD-10-CM

## 2023-02-21 DIAGNOSIS — R000 Tachycardia, unspecified: Secondary | ICD-10-CM

## 2023-02-21 DIAGNOSIS — R5381 Other malaise: Secondary | ICD-10-CM | POA: Diagnosis not present

## 2023-02-21 DIAGNOSIS — R0789 Other chest pain: Secondary | ICD-10-CM

## 2023-02-21 LAB — POCT INFLUENZA A/B
Influenza A, POC: NEGATIVE
Influenza B, POC: NEGATIVE

## 2023-02-21 LAB — POC COVID19 BINAXNOW: SARS Coronavirus 2 Ag: NEGATIVE

## 2023-02-21 NOTE — Telephone Encounter (Signed)
Copied from CRM [id number]. Topic: Clinical - Medical Advice >> Feb 20, 2023 11:26 AM Desma Mcgregor wrote: Reason for CRM: Patient's mother Era Bumpers called in asking if there is an EKG machine in the office. She stated her son has an elevated heart rate and wants to get him checked before Thursday. Looking to speak with a nurse for further info. [phone]

## 2023-02-21 NOTE — Telephone Encounter (Signed)
FYI -   Contacted the patient - still having issues with elevated heart rate and SOB. Patient has been scheduled this afternoon at 340 pm with provider to address.

## 2023-02-21 NOTE — Progress Notes (Signed)
Acute Office Visit  Subjective:     Patient ID: Jerry Cisneros, male    DOB: [birth year], 22 y.o.   MRN: [id number]  Chief Complaint  Patient presents with   not feeling well    HPI Patient is in today for sxs that started on Sunday.  He says that morning felt just a little bit off he felt like he just had a little bit of brain fog but says he went and played pickle ball for 2 hours but by the time he got home he was just feeling bad he was having diffuse bodyaches chills though he had no fever.  He felt lightheaded.  And felt like he was having a little trouble breathing.  My his mom who is a nurse checked his oxygen level and was 87% and his heart rate was at 120.  He was not feeling tachycardic at the time.  He said he felt bad enough that he went to bed and pretty much stayed in bed the rest of the day and stayed in bed mostly yesterday today he feels a lot better.  Just a little tired.  He has had some nasal and chest congestion.  And headache.  ROS      Objective:    BP 131/66   Pulse 100   Temp 98.6 F (37 C) (Oral)   Ht 5\' 8"  (1.727 m)   Wt 239 lb (108.4 kg)   SpO2 96%   BMI 36.34 kg/m    Physical Exam Constitutional:      Appearance: Normal appearance.  HENT:     Head: Normocephalic and atraumatic.     Right Ear: Tympanic membrane, ear canal and external ear normal. There is no impacted cerumen.     Left Ear: Tympanic membrane, ear canal and external ear normal. There is no impacted cerumen.     Nose: Nose normal.     Mouth/Throat:     Pharynx: Oropharynx is clear.  Eyes:     Conjunctiva/sclera: Conjunctivae normal.  Cardiovascular:     Rate and Rhythm: Normal rate and regular rhythm.  Pulmonary:     Effort: Pulmonary effort is normal.     Breath sounds: Normal breath sounds.  Musculoskeletal:     Cervical back: Neck supple. No tenderness.  Lymphadenopathy:     Cervical: No cervical adenopathy.  Skin:    General: Skin is warm and dry.  Neurological:      Mental Status: He is alert and oriented to person, place, and time.  Psychiatric:        Mood and Affect: Mood normal.     Results for orders placed or performed in visit on 02/21/23  POC COVID-19  Result Value Ref Range   SARS Coronavirus 2 Ag Negative Negative  POCT Influenza A/B  Result Value Ref Range   Influenza A, POC Negative Negative   Influenza B, POC Negative Negative        Assessment & Plan:   Problem List Items Addressed This Visit   None Visit Diagnoses       Malaise and fatigue    -  Primary   Relevant Orders   POC COVID-19 (Completed)   POCT Influenza A/B (Completed)   EKG 12-Lead   CMP14+EGFR   TSH   CBC with Differential/Platelet   D-Dimer, Quantitative   C-reactive protein     SOB (shortness of breath)       Relevant Orders   EKG 12-Lead   CMP14+EGFR  TSH   CBC with Differential/Platelet   D-Dimer, Quantitative   C-reactive protein     Tachycardia       Relevant Orders   CMP14+EGFR   TSH   CBC with Differential/Platelet   D-Dimer, Quantitative   C-reactive protein     Chest tightness       Relevant Orders   CMP14+EGFR   TSH   CBC with Differential/Platelet   D-Dimer, Quantitative   C-reactive protein       EKG today shows rate of 80 bpm, normal sinus rhythm with no acute ST-T wave changes.  Negative for flu and COVID today.  Will get some labs just to rule out other causes.  But I really suspect that this was most consistent with a viral illness.  He does have a smart watch which can track his pulse when he is not exercising or active to see if he is having any more episodes of tachycardia.  No orders of the defined types were placed in this encounter.   No follow-ups on file.  Nani Gasser, MD

## 2023-02-22 LAB — CBC WITH DIFFERENTIAL/PLATELET
Basophils Absolute: 0.1 10*3/uL (ref 0.0–0.2)
Basos: 1 %
EOS (ABSOLUTE): 0.4 10*3/uL (ref 0.0–0.4)
Eos: 4 %
Hematocrit: 47.4 % (ref 37.5–51.0)
Hemoglobin: 16 g/dL (ref 13.0–17.7)
Immature Grans (Abs): 0 10*3/uL (ref 0.0–0.1)
Immature Granulocytes: 0 %
Lymphocytes Absolute: 2.2 10*3/uL (ref 0.7–3.1)
Lymphs: 22 %
MCH: 30.2 pg (ref 26.6–33.0)
MCHC: 33.8 g/dL (ref 31.5–35.7)
MCV: 89 fL (ref 79–97)
Monocytes Absolute: 1.2 10*3/uL — ABNORMAL HIGH (ref 0.1–0.9)
Monocytes: 12 %
Neutrophils Absolute: 6.2 10*3/uL (ref 1.4–7.0)
Neutrophils: 61 %
Platelets: 221 10*3/uL (ref 150–450)
RBC: 5.3 x10E6/uL (ref 4.14–5.80)
RDW: 11.9 % (ref 11.6–15.4)
WBC: 10 10*3/uL (ref 3.4–10.8)

## 2023-02-22 LAB — CMP14+EGFR
ALT: 38 [IU]/L (ref 0–44)
AST: 23 [IU]/L (ref 0–40)
Albumin: 4.4 g/dL (ref 4.3–5.2)
Alkaline Phosphatase: 148 [IU]/L — ABNORMAL HIGH (ref 51–125)
BUN/Creatinine Ratio: 15 (ref 9–20)
BUN: 14 mg/dL (ref 6–20)
Bilirubin Total: 0.2 mg/dL (ref 0.0–1.2)
CO2: 23 mmol/L (ref 20–29)
Calcium: 9.6 mg/dL (ref 8.7–10.2)
Chloride: 104 mmol/L (ref 96–106)
Creatinine, Ser: 0.91 mg/dL (ref 0.76–1.27)
Globulin, Total: 2.4 g/dL (ref 1.5–4.5)
Glucose: 96 mg/dL (ref 70–99)
Potassium: 3.8 mmol/L (ref 3.5–5.2)
Sodium: 143 mmol/L (ref 134–144)
Total Protein: 6.8 g/dL (ref 6.0–8.5)
eGFR: 124 mL/min/{1.73_m2} (ref >59)

## 2023-02-22 LAB — C-REACTIVE PROTEIN: CRP: 46 mg/L — ABNORMAL HIGH (ref 0–10)

## 2023-02-22 LAB — TSH: TSH: 3.07 u[IU]/mL (ref 0.450–4.500)

## 2023-02-22 NOTE — Progress Notes (Signed)
Hi Jerry Cisneros, so far labs overall look good.  Your alkaline phosphatase is just the elevated.  It is a liver enzyme.  But the additional liver enzymes are normal.  I doing to keep an eye on this and plan to recheck in about 1 to 2 months.  Encourage you to continue to work on healthy diet to reduce any inflammation in the liver.  Blood count looks great very reassuring your CRP is elevated.  It is a generalized inflammatory marker but can sometimes be elevated in people who are at higher risk for cardiovascular disease it is not directly related to the symptoms you were just having so please understand this is more of a predictor.  So again I would continue to work on healthy food choices and trying to exercise regularly.  Your thyroid looks great.  The D-dimer has not resulted back so organ to give the lab call this morning and see what might be going on.

## 2023-02-23 ENCOUNTER — Encounter (HOSPITAL_COMMUNITY): Payer: Self-pay

## 2023-02-23 ENCOUNTER — Other Ambulatory Visit: Payer: Self-pay | Admitting: *Deleted

## 2023-02-23 DIAGNOSIS — R748 Abnormal levels of other serum enzymes: Secondary | ICD-10-CM

## 2023-03-30 ENCOUNTER — Encounter: Payer: Self-pay | Admitting: Professional

## 2023-03-30 ENCOUNTER — Ambulatory Visit: Payer: 59 | Admitting: Professional

## 2023-03-30 DIAGNOSIS — F321 Major depressive disorder, single episode, moderate: Secondary | ICD-10-CM

## 2023-03-30 DIAGNOSIS — F419 Anxiety disorder, unspecified: Secondary | ICD-10-CM

## 2023-03-30 DIAGNOSIS — F429 Obsessive-compulsive disorder, unspecified: Secondary | ICD-10-CM | POA: Diagnosis not present

## 2023-03-30 NOTE — Progress Notes (Addendum)
 [facility] Counselor/Therapist Progress Note  Patient ID: Jerry Cisneros, MRN: [id number],    Date: 03/30/2023  Time Spent: 49 minutes 859-948am  Subjective: The patient arrived on time for his in person appointment.  Issues addressed: 1-holiday season -pt had a great holiday with his family -they went on a cruise and the patient had a good time -there was no contention and he admits it was good for him to get away 2-work -he has ended his employment due to his soon return to school -his employer did not have any PT options so pt is not working at this time. 3-college -pt is off to an okay start with school -he admits that he is taking a big load and worries about his ability to stay on top -his father is home all the time and he is easily distracted -discussed other options for studying including asking his brother to be his "study buddy" 4-home life -there is a lack of respect for the pt's space at home -his parents will enter his room without knocking and this makes the pt very anxious -he admits that he has been in the middle of masturbating when he hears the door opening 5-relationship -he continues to be in an [place]they have discussed meeting but still do not have a plan at this time 6-treatment plan -update completed  Treatment Plan Problems Addressed  Anxiety, Eating Disorders And Obesity, Family Conflict, Unipolar Depression Goals 1. Alleviate depressive symptoms and return to previous level of effective functioning. Objective Describe current and past experiences with depression including their impact on functioning and attempts to resolve it. Target Date: 2024-03-29 Frequency: biweekly  Progress: 100 Modality: individual  Related Interventions Encourage the client to share his/her thoughts and feelings of depression; express empathy and build rapport while identifying primary cognitive, behavioral,  interpersonal, or other contributors to depression. Assess current and past mood episodes including their features, frequency, severity, and duration (e.g., clinical interview supplemented by the Inventory to Diagnose Depression). Objective Identify and replace thoughts and beliefs that support depression. Target Date: 2024-03-29 Frequency: biweekly  Progress: 70 Modality: individual  Related Interventions Conduct Cognitive-Behavioral Therapy (see Cognitive Behavior Therapy by Reola Calkins; Overcoming Depression by Agapito Games al.), beginning with helping the client learn the connection among cognition, depressive feelings, and actions. Assign the client to self-monitor thoughts, feelings, and actions in daily journal (e.g., "Negative Thoughts Trigger Negative Feelings" in the Adult Psychotherapy Homework Planner by Stephannie Li; "Daily Record of Dysfunctional Thoughts" in Cognitive Therapy of Depression by Ashby Dawes and Shea Evans); process the journal material to challenge depressive thinking patterns and replace them with reality-based thoughts. Assign "behavioral experiments" in which depressive automatic thoughts are treated as hypotheses/prediction, reality-based alternative hypotheses/prediction are generated, and both are tested against the client's past, present, and/or future experiences. Facilitate and reinforce the client's shift from biased depressive self-talk and beliefs to reality-based cognitive messages that enhance self-confidence and increase adaptive actions (see "Positive Self-Talk" in the Adult Psychotherapy Homework Planner by Stephannie Li). Objective Learn and implement behavioral strategies to overcome depression. Target Date: 2024-03-29 Frequency: biweekly  Progress: 40 Modality: individual  Related Interventions Engage the client in "behavioral activation," increasing his/her activity level and contact with sources of reward, while identifying processes that inhibit activation (see  Behavioral Activation for Depression by Katharine Look, Dimidjian, and Herman-Dunn; or assign "Identify and Schedule Pleasant Activities" in the Adult Psychotherapy Homework Planner by Stephannie Li); use behavioral techniques such as instruction, rehearsal, role-playing, role  reversal, as needed, to facilitate activity in the client's daily life; reinforce success. Assist the client in developing skills that increase the likelihood of deriving pleasure from behavioral activation (e.g., assertiveness skills, developing an exercise plan, less internal/more external focus, increased social involvement); reinforce success. Objective Verbalize an understanding and resolution of current interpersonal problems. Target Date: 2024-03-29 Frequency: biweekly  Progress: 60 Modality: individual  Related Interventions For role transitions (e.g., beginning or ending a relationship or career, moving, promotion, retirement, graduation), help the client mourn the loss of the old role while recognizing positive and negative aspects of the new role, and taking steps to gain mastery over the new role. Objective Learn and implement problem-solving and decision-making skills. Target Date: 2024-03-29 Frequency: biweekly  Progress: 40 Modality: individual  Objective Learn and implement conflict resolution skills to resolve interpersonal problems. Target Date: 2024-03-29 Frequency: biweekly  Progress: 50 Modality: individual  Related Interventions Teach conflict resolution skills (e.g., empathy, active listening, "I messages," respectful communication, assertiveness without aggression, compromise); use psychoeducation, modeling, role-playing, and rehearsal to work through several current conflicts; assign homework exercises; review and repeat so as to integrate their use into the client's life. 2. Appropriately grieve the loss in order to normalize mood and to return to previously adaptive level of functioning. 3. Decrease the level of  present conflict with parents while beginning to let go of or resolving past conflicts with them. Objective Describe the conflicts and the causes of conflicts between self and parents. Target Date: [id number] Frequency: biweekly  Progress: 80 Modality: individual  Related Interventions Give verbal permission for the client to have and express own feelings, thoughts, and perspectives in order to foster a sense of autonomy from family. Explore the nature of the client's family conflicts and their perceived causes. Objective Identify own as well as others' role in the family conflicts. Target Date: 2024-03-29 Frequency: biweekly  Progress: 40 Modality: individual  Related Interventions Confront the client when he/she is not taking responsibility for his/her role in the family conflict and reinforce the client for owning responsibility for his/her contribution to the conflict. Objective Increase the level of independent functioning. Target Date: 2024-03-29 Frequency: biweekly  Progress: 50 Modality: individual  Related Interventions Confront the client's emotional dependence and avoidance of economic responsibility that promotes continuing pattern of living with parents; develop a plan for the client's healthy and responsible emancipation from parents that is, if possible, complete with their blessing (e.g., finding and keeping a job, saving money, socializing with friends, finding own housing, etc.). Objective Report an increase in resolving conflicts with parents by talking calmly and assertively rather than aggressively and defensively. Target Date: 2024-03-29 Frequency: biweekly  Progress: 30 Modality: individual  Related Interventions Use role-playing, role reversal, modeling, and behavioral rehearsal to help the client develop assertive ways to resolve conflict with parents (recommend Your Perfect Right: Assertiveness and Equality in Your Life and Relationships by Carnella Guadalajara). 4. Develop coping strategies (e.g., feeling identification, problem-solving, assertiveness) to address emotional issues that could lead to relapse of the eating disorder. 5. Develop healthy cognitive patterns and beliefs about self that lead to positive identity and prevent a relapse of the eating disorder. 6. Develop healthy interpersonal relationships that lead to alleviation and help prevent the relapse of the eating disorder. 7. Develop healthy interpersonal relationships that lead to the alleviation and help prevent the relapse of depression. 8. Develop healthy thinking patterns and beliefs about self, others, and the world that lead to the alleviation and help prevent  the relapse of depression. 9. Enhance ability to effectively cope with the full variety of life's worries and anxieties. 10. Learn and implement coping skills that result in a reduction of anxiety and worry, and improved daily functioning. 11. Reach a level of reduced tension, increased satisfaction, and improved communication with family and/or other authority figures. 12. Recognize, accept, and cope with feelings of depression. 13. Restore normal eating patterns, healthy weight maintenance, and a realistic appraisal of body size. Objective Honestly describe the pattern of eating including types, amounts, and frequency of food consumed or hoarded. Target Date: 2024-03-29 Frequency: biweekly  Progress: 70 Modality: individual  Related Interventions Assess the historical course of the disorder including the amount, type, and pattern of the client's food intake (e.g., too little food, too much food, binge eating, or hoarding food); perceived personal and interpersonal triggers and personal goals. Objective Establish regular eating patterns by eating at regular intervals and consuming optimal daily calories. Target Date: 2024-03-29 Frequency: biweekly  Progress: 50 Modality: individual  Related Interventions Establish  healthy weight goals for the client per the Body Mass Index (BMI), the [place] Height and Weight Tables, or some other recognized standard. Objective Learn and implement skills for managing urges to engage in unhealthy eating or weight loss practices. Target Date: 2024-03-29 Frequency: biweekly  Progress: 50 Modality: individual  Related Interventions Teach the client tailored skills to manage high-risk situations including distraction, positive self-talk, problem-solving, conflict resolution (e.g., empathy, active listening, "I messages," respectful communication, assertiveness without aggression, compromise), or other social/ communication skills; use modeling, role-playing, and behavior rehearsal to work through several current situations. Objective State a basis for positive identity that is not based on weight and appearance but on character, traits, relationships, and intrinsic value. Target Date: 2024-03-29 Frequency: biweekly  Progress: 60 Modality: individual  Related Interventions Assist the client in identifying a basis for self-worth apart from body image by reviewing his/her talents, successes, positive traits, importance to others, and intrinsic spiritual value. Objective Verbalize an understanding of relapse prevention and the distinction between a lapse and a relapse. Target Date: 2024-03-29 Frequency: biweekly  Progress: 30 Modality: individual  Related Interventions Discuss with the client the distinction between a lapse and relapse, associating a lapse with an initial and reversible return of distress, urges, or to avoid, and relapse with the decision to return to the cycle of maladaptive thoughts and actions (e.g., feeling anxious, binging, then purging). Identify with the client future situations or circumstances in which lapses could occur. 14. Stabilize anxiety level while increasing ability to function on a daily basis. Objective Learn and implement problem-solving  strategies for realistically addressing worries. Target Date: 2024-03-29 Frequency: biweekly  Progress: 30 Modality: individual  Related Interventions Teach the client problem-solving strategies involving specifically defining a problem, generating options for addressing it, evaluating the pros and cons of each option, selecting and implementing an optional action, and reevaluating and refining the action (or assign "Applying Problem-Solving to Interpersonal Conflict" in the Adult Psychotherapy Homework Planner by Stephannie Li). Objective Identify, challenge, and replace biased, fearful self-talk with positive, realistic, and empowering self-talk. Target Date: 2024-2026-03-2310-19 Frequency: biweekly  Progress: 50 Modality: individual  Related Interventions Explore the client's schema and self-talk that mediate his/her fear response; assist him/her in challenging the biases; replace the distorted messages with reality-based alternatives and positive, realistic self-talk that will increase his/her self-confidence in coping with irrational fears (see Cognitive Therapy of Anxiety Disorders by Laurence Slate). Assign the client a homework exercise in which he/she identifies  fearful self-talk, identifies biases in the self-talk, generates alternatives, and tests through behavioral experiments (or assign "Negative Thoughts Trigger Negative Feelings" in the Adult Psychotherapy Homework Planner by [facility]); review and reinforce success, providing corrective feedback toward improvement. Objective Learn to accept limitations in life and commit to tolerating, rather than avoiding, unpleasant emotions while accomplishing meaningful goals. Target Date: 2024-03-29 Frequency: biweekly  Progress: 40 Modality: individual  Related Interventions Use techniques from Acceptance and Commitment Therapy to help client accept uncomfortable realities such as lack of complete control, imperfections, and uncertainty and tolerate  unpleasant emotions and thoughts in order to accomplish value-consistent goals. Objective Learn and implement personal and interpersonal skills to reduce anxiety and improve interpersonal relationships. Target Date: 2024-03-29 Frequency: biweekly  Progress: 60 Modality: individual  Related Interventions Use instruction, modeling, and role-playing to build the client's general social, communication, and/or conflict resolution skills. Objective Maintain involvement in work, family, and social activities. Target Date: 2024-03-29 Frequency: biweekly  Progress: 60 Modality: individual  Related Interventions Support the client in following through with work, family, and social activities rather than escaping or avoiding them to focus on anxiety. 15. Terminate overeating and implement lifestyle changes that lead to weight loss and improved health. 16. Terminate the pattern of binge eating and purging behavior with a return to eating normal amounts of nutritious foods.  Diagnosis:MDD (major depressive disorder), single episode, moderate (HCC)  Obsessive-compulsive disorder, unspecified type  Anxiety  Plan:  -meet again on Thursday, Feb 6th, 2025 at [place] in-person

## 2023-04-04 ENCOUNTER — Encounter (HOSPITAL_COMMUNITY): Payer: Self-pay | Admitting: Psychiatry

## 2023-04-04 ENCOUNTER — Ambulatory Visit (INDEPENDENT_AMBULATORY_CARE_PROVIDER_SITE_OTHER): Payer: 59 | Admitting: Psychiatry

## 2023-04-04 VITALS — BP 124/90 | HR 69 | Ht 68.0 in | Wt 240.0 lb

## 2023-04-04 DIAGNOSIS — F321 Major depressive disorder, single episode, moderate: Secondary | ICD-10-CM

## 2023-04-04 DIAGNOSIS — F429 Obsessive-compulsive disorder, unspecified: Secondary | ICD-10-CM | POA: Diagnosis not present

## 2023-04-04 DIAGNOSIS — F419 Anxiety disorder, unspecified: Secondary | ICD-10-CM

## 2023-04-04 MED ORDER — FLUOXETINE HCL 10 MG PO CAPS: 10.0000 mg | ORAL_CAPSULE | Freq: Every day | ORAL | 1 refills | Status: DC

## 2023-04-04 MED ORDER — FLUOXETINE HCL 20 MG PO CAPS: 20.0000 mg | ORAL_CAPSULE | Freq: Every day | ORAL | 1 refills | Status: DC

## 2023-04-04 NOTE — Progress Notes (Signed)
[facility] Follow up visit  Patient Identification: Jerry Cisneros MRN:  [id number] Date of Evaluation:  04/04/2023 Referral Source: primary care Chief Complaint:   Chief Complaint  Patient presents with   Follow-up  Depression  Visit Diagnosis:    ICD-10-CM   1. MDD (major depressive disorder), single episode, moderate (HCC)  F32.1     2. Obsessive-compulsive disorder, unspecified type  F42.9     3. Anxiety  F41.9       History of Present Illness: Patient is a 23 years old white male living with his parents and younger brother initially referred by primary care physician to establish care he is seeing therapist Teofilo Pod referred for assessment for of depression and anxiety and OCD.  Has started community college but stressed, says may need to drop some subject. Apparently didn't get the 10mg  prozac so is been on 20mg   Still having difficulty socizling and binge eating  Dad gets mad and some challenges at home    There is no clear manic symptoms   Aggravating factors:  difficult growing up  , religious and strict parents, obsessive thoughts of eating,, lonliness Modifying factors: online chats , online friends  Duration since age 77 Severity gets anxious  Prior psych admission denies Suicide attempt denies  Drug or alcohol use denies  Past Psychiatric History: depression, anxiety   Previous Psychotropic Medications:   Substance Abuse History in the last 12 months:  No.  Consequences of Substance Abuse: NA  Past Medical History: History reviewed. No pertinent past medical history.  Past Surgical History:  Procedure Laterality Date   NO PAST SURGERIES      Family Psychiatric History: dad questionable: mom possible depression, one uncle many hospital admissions. Diagnosis not known  Family History:  Family History  Problem Relation Age of Onset   Psoriasis Father     Social History:   Social History   Socioeconomic History   Marital status: Single     Spouse name: Not on file   Number of children: Not on file   Years of education: Not on file   Highest education level: Some college, no degree  Occupational History   Occupation: Consulting civil engineer  Tobacco Use   Smoking status: Never   Smokeless tobacco: Never  Substance and Sexual Activity   Alcohol use: Not on file   Drug use: Not on file   Sexual activity: Not on file  Other Topics Concern   Not on file  Social History Narrative   Mother is from [place] originally.    Social Drivers of Corporate investment banker Strain: Low Risk  (02/21/2023)   Overall Financial Resource Strain (CARDIA)    Difficulty of Paying Living Expenses: Not hard at all  Food Insecurity: No Food Insecurity (02/21/2023)   Hunger Vital Sign    Worried About Running Out of Food in the Last Year: Never true    Ran Out of Food in the Last Year: Never true  Transportation Needs: No Transportation Needs (02/21/2023)   PRAPARE - Administrator, Civil Service (Medical): No    Lack of Transportation (Non-Medical): No  Physical Activity: Sufficiently Active (02/21/2023)   Exercise Vital Sign    Days of Exercise per Week: 5 days    Minutes of Exercise per Session: 150+ min  Stress: No Stress Concern Present (02/21/2023)   [employer] of Occupational Health - Occupational Stress Questionnaire    Feeling of Stress : Only a little  Social Connections: Moderately Isolated (  02/21/2023)   Social Connection and Isolation Panel [NHANES]    Frequency of Communication with Friends and Family: Never    Frequency of Social Gatherings with Friends and Family: Once a week    Attends Religious Services: More than 4 times per year    Active Member of [employer] or Organizations: Yes    Attends Engineer, structural: More than 4 times per year    Marital Status: Never married    Additional Social History: grew up with parents and young sibling brother. Strict controlling family, reglegious, had to go to church  everything was sin if watch tv or earthly related. Difficult growing up  Allergies:   Allergies  Allergen Reactions   Erythromycin     Upset stomach    Metabolic Disorder Labs: No results found for: "HGBA1C", "MPG" No results found for: "PROLACTIN" Lab Results  Component Value Date   CHOL 162 12/31/2021   TRIG 54 12/31/2021   HDL 51 12/31/2021   CHOLHDL 3.2 12/31/2021   LDLCALC 97 12/31/2021   LDLCALC 81 01/10/2019   Lab Results  Component Value Date   TSH 3.070 02/21/2023    Therapeutic Level Labs: No results found for: "LITHIUM" No results found for: "CBMZ" No results found for: "VALPROATE"  Current Medications: Current Outpatient Medications  Medication Sig Dispense Refill   FLUoxetine (PROZAC) 10 MG capsule Take 1 capsule (10 mg total) by mouth daily. 30 capsule 1   FLUoxetine (PROZAC) 20 MG capsule Take 1 capsule (20 mg total) by mouth daily. 30 capsule 1   No current facility-administered medications for this visit.     Psychiatric Specialty Exam: Review of Systems  Cardiovascular:  Negative for chest pain.  Neurological:  Negative for tremors.  Psychiatric/Behavioral:  Negative for agitation. The patient is nervous/anxious.     Blood pressure (!) 124/90, pulse 69, height 5\' 8"  (1.727 m), weight 240 lb (108.9 kg).Body mass index is 36.49 kg/m.  General Appearance: Casual  Eye Contact:  Fair  Speech:  Clear and Coherent  Volume:  Normal  Mood:  somewhat stressed  Affect:  Constricted  Thought Process:  Goal Directed  Orientation:  Full (Time, Place, and Person)  Thought Content:  Obsessions and Rumination  Suicidal Thoughts:  No  Homicidal Thoughts:  No  Memory:  Immediate;   Fair  Judgement:  Fair  Insight:  Shallow  Psychomotor Activity:  Normal  Concentration:  Concentration: Fair  Recall:  [employer] of Knowledge:Good  Language: Fair  Akathisia:  No  Handed:    AIMS (if indicated):  not done  Assets:  Desire for  Improvement Housing Physical Health  ADL's:  Intact  Cognition: WNL  Sleep:  Fair   Screenings: GAD-7    Advertising copywriter from 11/15/2022 in [facility] Behavioral Medicine at [employer] Counselor from 08/04/2022 in [facility] Behavioral Medicine at [employer] Counselor from 02/10/2022 in [facility] Behavioral Medicine at [facility] Office Visit from 05/18/2021 in [facility] Primary Care & Sports Medicine at [facility] Office Visit from 01/14/2019 in [facility] Primary Care & Sports Medicine at [facility]  Total GAD-7 Score 10 8 9 10 4       PHQ2-9    Flowsheet Row Counselor from 02/16/2023 in [facility] Behavioral Medicine at [facility] Counselor from 11/23/2022 in [facility] Behavioral Medicine at [facility] Office Visit from 09/20/2022 in [facility] Health at [facility] Counselor from 08/04/2022 in  [facility] Behavioral Medicine at [facility] Counselor from 02/10/2022 in [facility] Behavioral Medicine at [facility]  PHQ-2 Total Score 1 1 2 3 1   PHQ-9 Total Score 11 -- 16 17 14       SBQ-R    Flowsheet Row Counselor from 08/04/2022 in [facility] Behavioral Medicine at [facility]  SBQ-R Total Score 8      Flowsheet Row Office Visit from 01/31/2023 in [facility] at [facility] Office Visit from 09/20/2022 in [facility] at [facility]  C-SSRS RISK CATEGORY No Risk Error: Q3, 4, or 5 should not be populated when Q2 is No       Assessment and Plan: as follows    Prior documentation reviewed   Major depressive disorder recurrent moderate to severe; gets subdued , increase prozac to 30mg    Generalized anxiety disorder; stressed related to college , increase prozac to 30mg  May  drop some credit hours   OCD; baseline, continue prozac and therapy to work on distraction from eating or compulsions  Fu 54m.  Direct care time spent  20 minutes  with chart review, documentation elaboration and face-to-face Collaboration of Care: Primary Care Provider AEB notes and chart reviewed  Patient/Guardian was advised Release of Information must be obtained prior to any record release in order to collaborate their care with an outside provider. Patient/Guardian was advised if they have not already done so to contact the registration department to sign all necessary forms in order for [place] to release information regarding their care.   Consent: Patient/Guardian gives verbal consent for treatment and assignment of benefits for services provided during this visit. Patient/Guardian expressed understanding and agreed to proceed.   Thresa Ross, MD 1/28/20251:33 PM

## 2023-04-06 ENCOUNTER — Encounter: Payer: Self-pay | Admitting: Professional

## 2023-04-06 ENCOUNTER — Ambulatory Visit: Payer: 59 | Admitting: Professional

## 2023-04-06 DIAGNOSIS — F321 Major depressive disorder, single episode, moderate: Secondary | ICD-10-CM

## 2023-04-06 DIAGNOSIS — F419 Anxiety disorder, unspecified: Secondary | ICD-10-CM | POA: Diagnosis not present

## 2023-04-06 DIAGNOSIS — F429 Obsessive-compulsive disorder, unspecified: Secondary | ICD-10-CM

## 2023-04-06 NOTE — Progress Notes (Addendum)
  Behavioral Health Counselor/Therapist Progress Note  Patient ID: Jerry Cisneros, MRN: [id number],    Date: 04/06/2023  Time Spent: 59 minutes [id number]  Subjective: The patient arrived on time for his in-person appointment.  Issues addressed: 1-mood -depressed and anxious -pt cannot get organized or motivated to complete college classes 2-school -he is doing poorly in all classes -discussed how to request support at school -he has had a contact and is to follow up later today -he cannot organize himself and will procrastinate and then operate in panic mode -pt is anxious and depressed that he cannot complete -uncertain of next steps 3-coping and potential next steps -discussed MH-IOP with pt and he is interested   -provided him different options for him to chose what would be most helpful -call placed in session on speaker phone so pt is aware of content of conversation -pt took over call at Admissions request with Garen Lah -pt will be meeting tomorrow to discuss with admissions -pt shared that he wishes to do the in person program on Mon, Tues, and Thur evenings  Treatment Plan Problems Addressed  Anxiety, Eating Disorders And Obesity, Family Conflict, Unipolar Depression Goals 1. Alleviate depressive symptoms and return to previous level of effective functioning. Objective Describe current and past experiences with depression including their impact on functioning and attempts to resolve it. Target Date: 2024-04-05 Frequency: biweekly  Progress: 100 Modality: individual  Related Interventions Encourage the client to share his/her thoughts and feelings of depression; express empathy and build rapport while identifying primary cognitive, behavioral, interpersonal, or other contributors to depression. Assess current and past mood episodes including their features, frequency, severity, and duration (e.g., clinical interview supplemented by the Inventory to  Diagnose Depression). Objective Identify and replace thoughts and beliefs that support depression. Target Date: 2024-04-05 Frequency: biweekly  Progress: 70 Modality: individual  Related Interventions Conduct Cognitive-Behavioral Therapy (see Cognitive Behavior Therapy by Reola Calkins; Overcoming Depression by Agapito Games al.), beginning with helping the client learn the connection among cognition, depressive feelings, and actions. Assign the client to self-monitor thoughts, feelings, and actions in daily journal (e.g., "Negative Thoughts Trigger Negative Feelings" in the Adult Psychotherapy Homework Planner by Stephannie Li; "Daily Record of Dysfunctional Thoughts" in Cognitive Therapy of Depression by Ashby Dawes and Shea Evans); process the journal material to challenge depressive thinking patterns and replace them with reality-based thoughts. Assign "behavioral experiments" in which depressive automatic thoughts are treated as hypotheses/prediction, reality-based alternative hypotheses/prediction are generated, and both are tested against the client's past, present, and/or future experiences. Facilitate and reinforce the client's shift from biased depressive self-talk and beliefs to reality-based cognitive messages that enhance self-confidence and increase adaptive actions (see "Positive Self-Talk" in the Adult Psychotherapy Homework Planner by Stephannie Li). Objective Learn and implement behavioral strategies to overcome depression. Target Date: 2024-04-05 Frequency: biweekly  Progress: 40 Modality: individual  Related Interventions Engage the client in "behavioral activation," increasing his/her activity level and contact with sources of reward, while identifying processes that inhibit activation (see Behavioral Activation for Depression by Katharine Look, Dimidjian, and Herman-Dunn; or assign "Identify and Schedule Pleasant Activities" in the Adult Psychotherapy Homework Planner by [facility]); use behavioral techniques such  as instruction, rehearsal, role-playing, role reversal, as needed, to facilitate activity in the client's daily life; reinforce success. Assist the client in developing skills that increase the likelihood of deriving pleasure from behavioral activation (e.g., assertiveness skills, developing an exercise plan, less internal/more external focus, increased social involvement); reinforce success. Objective Verbalize an understanding and resolution of current interpersonal problems.  Target Date: 2024-04-05 Frequency: biweekly  Progress: 60 Modality: individual  Related Interventions For role transitions (e.g., beginning or ending a relationship or career, moving, promotion, retirement, graduation), help the client mourn the loss of the old role while recognizing positive and negative aspects of the new role, and taking steps to gain mastery over the new role. Objective Learn and implement problem-solving and decision-making skills. Target Date: 2024-04-05 Frequency: biweekly  Progress: 40 Modality: individual  Objective Learn and implement conflict resolution skills to resolve interpersonal problems. Target Date: 2024-04-05 Frequency: biweekly  Progress: 50 Modality: individual  Related Interventions Teach conflict resolution skills (e.g., empathy, active listening, "I messages," respectful communication, assertiveness without aggression, compromise); use psychoeducation, modeling, role-playing, and rehearsal to work through several current conflicts; assign homework exercises; review and repeat so as to integrate their use into the client's life. 2. Appropriately grieve the loss in order to normalize mood and to return to previously adaptive level of functioning. 3. Decrease the level of present conflict with parents while beginning to let go of or resolving past conflicts with them. Objective Describe the conflicts and the causes of conflicts between self and parents. Target Date: 2024-04-05  Frequency: biweekly  Progress: 80 Modality: individual  Related Interventions Give verbal permission for the client to have and express own feelings, thoughts, and perspectives in order to foster a sense of autonomy from family. Explore the nature of the client's family conflicts and their perceived causes. Objective Identify own as well as others' role in the family conflicts. Target Date: 2024-04-05 Frequency: biweekly  Progress: 40 Modality: individual  Related Interventions Confront the client when he/she is not taking responsibility for his/her role in the family conflict and reinforce the client for owning responsibility for his/her contribution to the conflict. Objective Increase the level of independent functioning. Target Date: 2024-04-05 Frequency: biweekly  Progress: 50 Modality: individual  Related Interventions Confront the client's emotional dependence and avoidance of economic responsibility that promotes continuing pattern of living with parents; develop a plan for the client's healthy and responsible emancipation from parents that is, if possible, complete with their blessing (e.g., finding and keeping a job, saving money, socializing with friends, finding own housing, etc.). Objective Report an increase in resolving conflicts with parents by talking calmly and assertively rather than aggressively and defensively. Target Date: 2024-04-05 Frequency: biweekly  Progress: 30 Modality: individual  Related Interventions Use role-playing, role reversal, modeling, and behavioral rehearsal to help the client develop assertive ways to resolve conflict with parents (recommend Your Perfect Right: Assertiveness and Equality in Your Life and Relationships by Carnella Guadalajara). 4. Develop coping strategies (e.g., feeling identification, problem-solving, assertiveness) to address emotional issues that could lead to relapse of the eating disorder. 5. Develop healthy cognitive patterns and  beliefs about self that lead to positive identity and prevent a relapse of the eating disorder. 6. Develop healthy interpersonal relationships that lead to alleviation and help prevent the relapse of the eating disorder. 7. Develop healthy interpersonal relationships that lead to the alleviation and help prevent the relapse of depression. 8. Develop healthy thinking patterns and beliefs about self, others, and the world that lead to the alleviation and help prevent the relapse of depression. 9. Enhance ability to effectively cope with the full variety of life's worries and anxieties. 10. Learn and implement coping skills that result in a reduction of anxiety and worry, and improved daily functioning. 11. Reach a level of reduced tension, increased satisfaction, and improved communication with family and/or other authority  figures. 12. Recognize, accept, and cope with feelings of depression. 13. Restore normal eating patterns, healthy weight maintenance, and a realistic appraisal of body size. Objective Honestly describe the pattern of eating including types, amounts, and frequency of food consumed or hoarded. Target Date: 2024-04-05 Frequency: biweekly  Progress: 70 Modality: individual  Related Interventions Assess the historical course of the disorder including the amount, type, and pattern of the client's food intake (e.g., too little food, too much food, binge eating, or hoarding food); perceived personal and interpersonal triggers and personal goals. Objective Establish regular eating patterns by eating at regular intervals and consuming optimal daily calories. Target Date: 2024-04-05 Frequency: biweekly  Progress: 50 Modality: individual  Related Interventions Establish healthy weight goals for the client per the Body Mass Index (BMI), the [employer] Height and Weight Tables, or some other recognized standard. Objective Learn and implement skills for managing urges to engage in unhealthy  eating or weight loss practices. Target Date: 2024-04-05 Frequency: biweekly  Progress: 50 Modality: individual  Related Interventions Teach the client tailored skills to manage high-risk situations including distraction, positive self-talk, problem-solving, conflict resolution (e.g., empathy, active listening, "I messages," respectful communication, assertiveness without aggression, compromise), or other social/ communication skills; use modeling, role-playing, and behavior rehearsal to work through several current situations. Objective State a basis for positive identity that is not based on weight and appearance but on character, traits, relationships, and intrinsic value. Target Date: 2024-04-05 Frequency: biweekly  Progress: 60 Modality: individual  Related Interventions Assist the client in identifying a basis for self-worth apart from body image by reviewing his/her talents, successes, positive traits, importance to others, and intrinsic spiritual value. Objective Verbalize an understanding of relapse prevention and the distinction between a lapse and a relapse. Target Date: 2024-04-05 Frequency: biweekly  Progress: 30 Modality: individual  Related Interventions Discuss with the client the distinction between a lapse and relapse, associating a lapse with an initial and reversible return of distress, urges, or to avoid, and relapse with the decision to return to the cycle of maladaptive thoughts and actions (e.g., feeling anxious, binging, then purging). Identify with the client future situations or circumstances in which lapses could occur. 14. Stabilize anxiety level while increasing ability to function on a daily basis. Objective Learn and implement problem-solving strategies for realistically addressing worries. Target Date: 2024-04-05 Frequency: biweekly  Progress: 30 Modality: individual  Related Interventions Teach the client problem-solving strategies involving specifically defining  a problem, generating options for addressing it, evaluating the pros and cons of each option, selecting and implementing an optional action, and reevaluating and refining the action (or assign "Applying Problem-Solving to Interpersonal Conflict" in the Adult Psychotherapy Homework Planner by Stephannie Li). Objective Identify, challenge, and replace biased, fearful self-talk with positive, realistic, and empowering self-talk. Target Date: 2024-04-05 Frequency: biweekly  Progress: 50 Modality: individual  Related Interventions Explore the client's schema and self-talk that mediate his/her fear response; assist him/her in challenging the biases; replace the distorted messages with reality-based alternatives and positive, realistic self-talk that will increase his/her self-confidence in coping with irrational fears (see Cognitive Therapy of Anxiety Disorders by Laurence Slate). Assign the client a homework exercise in which he/she identifies fearful self-talk, identifies biases in the self-talk, generates alternatives, and tests through behavioral experiments (or assign "Negative Thoughts Trigger Negative Feelings" in the Adult Psychotherapy Homework Planner by [facility]); review and reinforce success, providing corrective feedback toward improvement. Objective Learn to accept limitations in life and commit to tolerating, rather than avoiding, unpleasant emotions while  accomplishing meaningful goals. Target Date: 2024-04-05 Frequency: biweekly  Progress: 40 Modality: individual  Related Interventions Use techniques from Acceptance and Commitment Therapy to help client accept uncomfortable realities such as lack of complete control, imperfections, and uncertainty and tolerate unpleasant emotions and thoughts in order to accomplish value-consistent goals. Objective Learn and implement personal and interpersonal skills to reduce anxiety and improve interpersonal relationships. Target Date: 2024-04-05 Frequency:  biweekly  Progress: 60 Modality: individual  Related Interventions Use instruction, modeling, and role-playing to build the client's general social, communication, and/or conflict resolution skills. Objective Maintain involvement in work, family, and social activities. Target Date: 2024-04-05 Frequency: biweekly  Progress: 60 Modality: individual  Related Interventions Support the client in following through with work, family, and social activities rather than escaping or avoiding them to focus on anxiety. 15. Terminate overeating and implement lifestyle changes that lead to weight loss and improved health. 16. Terminate the pattern of binge eating and purging behavior with a return to eating normal amounts of nutritious foods.  Diagnosis:MDD (major depressive disorder), single episode, moderate (HCC)  Obsessive-compulsive disorder, unspecified type  Anxiety  Plan:  -meet again after patient has completed MH-IOP. -pt aware that appointments are still in calendar and he is to keep me up to date regarding his return to [facility].

## 2023-04-13 ENCOUNTER — Ambulatory Visit: Payer: Self-pay | Admitting: Professional

## 2023-04-17 ENCOUNTER — Ambulatory Visit: Payer: 59 | Admitting: Professional

## 2023-04-27 ENCOUNTER — Ambulatory Visit: Payer: BC Managed Care – PPO | Admitting: Professional

## 2023-05-11 ENCOUNTER — Ambulatory Visit: Payer: Self-pay | Admitting: Professional

## 2023-05-25 ENCOUNTER — Ambulatory Visit: Payer: Self-pay | Admitting: Professional

## 2023-06-01 ENCOUNTER — Ambulatory Visit (HOSPITAL_COMMUNITY): Payer: 59 | Admitting: Psychiatry

## 2023-06-08 ENCOUNTER — Ambulatory Visit: Payer: Self-pay | Admitting: Professional

## 2023-06-08 DIAGNOSIS — F321 Major depressive disorder, single episode, moderate: Secondary | ICD-10-CM | POA: Diagnosis not present

## 2023-06-08 DIAGNOSIS — F429 Obsessive-compulsive disorder, unspecified: Secondary | ICD-10-CM | POA: Diagnosis not present

## 2023-06-08 NOTE — Progress Notes (Signed)
 [facility] Counselor/Therapist Progress Note  Patient ID: Jerry Cisneros, MRN: [id number],    Date: 06/08/2023  Time Spent: 55 minutes 9-955am  Subjective: The patient arrived on time for his in-person appointment.  Issues addressed: 1-MH-IOP at [facility] -PHP 9-3 for four weeks -IOP 9-12 2 weeks  2-mood -less depressed -some anxiety related to school 3-school -he dropped two classes and took one class on Ethics in Technology -he passed with a good grade -he has since started two new classes and he is not doing well -he admits that he is behind again and that he would be better suited for inperson -one of his professors reached out since he did not meet deadline on an assignment asking if there was a problem and the pt does not know how to respond -talked with patient about how to request assistance of professor -assisted patient in walking through a plan that could assist him in being more successful including studying at [employer], going to student support services for assistance, getting a tutor, and speaking with his professors 4-family -how to communicate anger without being disrespectful  Treatment Plan Problems Addressed  Anxiety, Eating Disorders And Obesity, Family Conflict, Unipolar Depression Goals 1. Alleviate depressive symptoms and return to previous level of effective functioning. Objective Describe current and past experiences with depression including their impact on functioning and attempts to resolve it. Target Date: 2024-04-05 Frequency: biweekly  Progress: 100 Modality: individual  Related Interventions Encourage the client to share his/her thoughts and feelings of depression; express empathy and build rapport while identifying primary cognitive, behavioral, interpersonal, or other contributors to depression. Assess current and past mood episodes including their features, frequency, severity, and duration (e.g., clinical interview  supplemented by the Inventory to Diagnose Depression). Objective Identify and replace thoughts and beliefs that support depression. Target Date: 2024-04-05 Frequency: biweekly  Progress: 70 Modality: individual  Related Interventions Conduct Cognitive-Behavioral Therapy (see Cognitive Behavior Therapy by Reola Calkins; Overcoming Depression by Agapito Games al.), beginning with helping the client learn the connection among cognition, depressive feelings, and actions. Assign the client to self-monitor thoughts, feelings, and actions in daily journal (e.g., "Negative Thoughts Trigger Negative Feelings" in the Adult Psychotherapy Homework Planner by Stephannie Li; "Daily Record of Dysfunctional Thoughts" in Cognitive Therapy of Depression by Ashby Dawes and Shea Evans); process the journal material to challenge depressive thinking patterns and replace them with reality-based thoughts. Assign "behavioral experiments" in which depressive automatic thoughts are treated as hypotheses/prediction, reality-based alternative hypotheses/prediction are generated, and both are tested against the client's past, present, and/or future experiences. Facilitate and reinforce the client's shift from biased depressive self-talk and beliefs to reality-based cognitive messages that enhance self-confidence and increase adaptive actions (see "Positive Self-Talk" in the Adult Psychotherapy Homework Planner by Stephannie Li). Objective Learn and implement behavioral strategies to overcome depression. Target Date: 2024-04-05 Frequency: biweekly  Progress: 40 Modality: individual  Related Interventions Engage the client in "behavioral activation," increasing his/her activity level and contact with sources of reward, while identifying processes that inhibit activation (see Behavioral Activation for Depression by Katharine Look, Dimidjian, and Herman-Dunn; or assign "Identify and Schedule Pleasant Activities" in the Adult Psychotherapy Homework Planner by  [facility]); use behavioral techniques such as instruction, rehearsal, role-playing, role reversal, as needed, to facilitate activity in the client's daily life; reinforce success. Assist the client in developing skills that increase the likelihood of deriving pleasure from behavioral activation (e.g., assertiveness skills, developing an exercise plan, less internal/more external focus, increased social involvement); reinforce success. Objective Verbalize an understanding  and resolution of current interpersonal problems. Target Date: 2024-04-05 Frequency: biweekly  Progress: 60 Modality: individual  Related Interventions For role transitions (e.g., beginning or ending a relationship or career, moving, promotion, retirement, graduation), help the client mourn the loss of the old role while recognizing positive and negative aspects of the new role, and taking steps to gain mastery over the new role. Objective Learn and implement problem-solving and decision-making skills. Target Date: 2024-04-05 Frequency: biweekly  Progress: 40 Modality: individual  Objective Learn and implement conflict resolution skills to resolve interpersonal problems. Target Date: 2024-04-05 Frequency: biweekly  Progress: 50 Modality: individual  Related Interventions Teach conflict resolution skills (e.g., empathy, active listening, "I messages," respectful communication, assertiveness without aggression, compromise); use psychoeducation, modeling, role-playing, and rehearsal to work through several current conflicts; assign homework exercises; review and repeat so as to integrate their use into the client's life. 2. Appropriately grieve the loss in order to normalize mood and to return to previously adaptive level of functioning. 3. Decrease the level of present conflict with parents while beginning to let go of or resolving past conflicts with them. Objective Describe the conflicts and the causes of conflicts between self and  parents. Target Date: 2024-04-05 Frequency: biweekly  Progress: 80 Modality: individual  Related Interventions Give verbal permission for the client to have and express own feelings, thoughts, and perspectives in order to foster a sense of autonomy from family. Explore the nature of the client's family conflicts and their perceived causes. Objective Identify own as well as others' role in the family conflicts. Target Date: 2024-04-05 Frequency: biweekly  Progress: 40 Modality: individual  Related Interventions Confront the client when he/she is not taking responsibility for his/her role in the family conflict and reinforce the client for owning responsibility for his/her contribution to the conflict. Objective Increase the level of independent functioning. Target Date: 2024-04-05 Frequency: biweekly  Progress: 50 Modality: individual  Related Interventions Confront the client's emotional dependence and avoidance of economic responsibility that promotes continuing pattern of living with parents; develop a plan for the client's healthy and responsible emancipation from parents that is, if possible, complete with their blessing (e.g., finding and keeping a job, saving money, socializing with friends, finding own housing, etc.). Objective Report an increase in resolving conflicts with parents by talking calmly and assertively rather than aggressively and defensively. Target Date: 2024-04-05 Frequency: biweekly  Progress: 30 Modality: individual  Related Interventions Use role-playing, role reversal, modeling, and behavioral rehearsal to help the client develop assertive ways to resolve conflict with parents (recommend Your Perfect Right: Assertiveness and Equality in Your Life and Relationships by Carnella Guadalajara). 4. Develop coping strategies (e.g., feeling identification, problem-solving, assertiveness) to address emotional issues that could lead to relapse of the eating disorder. 5. Develop  healthy cognitive patterns and beliefs about self that lead to positive identity and prevent a relapse of the eating disorder. 6. Develop healthy interpersonal relationships that lead to alleviation and help prevent the relapse of the eating disorder. 7. Develop healthy interpersonal relationships that lead to the alleviation and help prevent the relapse of depression. 8. Develop healthy thinking patterns and beliefs about self, others, and the world that lead to the alleviation and help prevent the relapse of depression. 9. Enhance ability to effectively cope with the full variety of life's worries and anxieties. 10. Learn and implement coping skills that result in a reduction of anxiety and worry, and improved daily functioning. 11. Reach a level of reduced tension, increased satisfaction, and improved  communication with family and/or other authority figures. 12. Recognize, accept, and cope with feelings of depression. 13. Restore normal eating patterns, healthy weight maintenance, and a realistic appraisal of body size. Objective Honestly describe the pattern of eating including types, amounts, and frequency of food consumed or hoarded. Target Date: 2024-04-05 Frequency: biweekly  Progress: 70 Modality: individual  Related Interventions Assess the historical course of the disorder including the amount, type, and pattern of the client's food intake (e.g., too little food, too much food, binge eating, or hoarding food); perceived personal and interpersonal triggers and personal goals. Objective Establish regular eating patterns by eating at regular intervals and consuming optimal daily calories. Target Date: 2024-04-05 Frequency: biweekly  Progress: 50 Modality: individual  Related Interventions Establish healthy weight goals for the client per the Body Mass Index (BMI), the [place] Height and Weight Tables, or some other recognized standard. Objective Learn and implement skills for  managing urges to engage in unhealthy eating or weight loss practices. Target Date: 2024-04-05 Frequency: biweekly  Progress: 50 Modality: individual  Related Interventions Teach the client tailored skills to manage high-risk situations including distraction, positive self-talk, problem-solving, conflict resolution (e.g., empathy, active listening, "I messages," respectful communication, assertiveness without aggression, compromise), or other social/ communication skills; use modeling, role-playing, and behavior rehearsal to work through several current situations. Objective State a basis for positive identity that is not based on weight and appearance but on character, traits, relationships, and intrinsic value. Target Date: 2024-04-05 Frequency: biweekly  Progress: 60 Modality: individual  Related Interventions Assist the client in identifying a basis for self-worth apart from body image by reviewing his/her talents, successes, positive traits, importance to others, and intrinsic spiritual value. Objective Verbalize an understanding of relapse prevention and the distinction between a lapse and a relapse. Target Date: 2024-04-05 Frequency: biweekly  Progress: 30 Modality: individual  Related Interventions Discuss with the client the distinction between a lapse and relapse, associating a lapse with an initial and reversible return of distress, urges, or to avoid, and relapse with the decision to return to the cycle of maladaptive thoughts and actions (e.g., feeling anxious, binging, then purging). Identify with the client future situations or circumstances in which lapses could occur. 14. Stabilize anxiety level while increasing ability to function on a daily basis. Objective Learn and implement problem-solving strategies for realistically addressing worries. Target Date: 2024-04-05 Frequency: biweekly  Progress: 30 Modality: individual  Related Interventions Teach the client problem-solving  strategies involving specifically defining a problem, generating options for addressing it, evaluating the pros and cons of each option, selecting and implementing an optional action, and reevaluating and refining the action (or assign "Applying Problem-Solving to Interpersonal Conflict" in the Adult Psychotherapy Homework Planner by Stephannie Li). Objective Identify, challenge, and replace biased, fearful self-talk with positive, realistic, and empowering self-talk. Target Date: 2024-04-05 Frequency: biweekly  Progress: 50 Modality: individual  Related Interventions Explore the client's schema and self-talk that mediate his/her fear response; assist him/her in challenging the biases; replace the distorted messages with reality-based alternatives and positive, realistic self-talk that will increase his/her self-confidence in coping with irrational fears (see Cognitive Therapy of Anxiety Disorders by Laurence Slate). Assign the client a homework exercise in which he/she identifies fearful self-talk, identifies biases in the self-talk, generates alternatives, and tests through behavioral experiments (or assign "Negative Thoughts Trigger Negative Feelings" in the Adult Psychotherapy Homework Planner by [facility]); review and reinforce success, providing corrective feedback toward improvement. Objective Learn to accept limitations in life and commit to tolerating,  rather than avoiding, unpleasant emotions while accomplishing meaningful goals. Target Date: 2024-04-05 Frequency: biweekly  Progress: 40 Modality: individual  Related Interventions Use techniques from Acceptance and Commitment Therapy to help client accept uncomfortable realities such as lack of complete control, imperfections, and uncertainty and tolerate unpleasant emotions and thoughts in order to accomplish value-consistent goals. Objective Learn and implement personal and interpersonal skills to reduce anxiety and improve interpersonal  relationships. Target Date: 2024-04-05 Frequency: biweekly  Progress: 60 Modality: individual  Related Interventions Use instruction, modeling, and role-playing to build the client's general social, communication, and/or conflict resolution skills. Objective Maintain involvement in work, family, and social activities. Target Date: 2024-04-05 Frequency: biweekly  Progress: 60 Modality: individual  Related Interventions Support the client in following through with work, family, and social activities rather than escaping or avoiding them to focus on anxiety. 15. Terminate overeating and implement lifestyle changes that lead to weight loss and improved health. 16. Terminate the pattern of binge eating and purging behavior with a return to eating normal amounts of nutritious foods.  Diagnosis:MDD (major depressive disorder), single episode, moderate (HCC)  Obsessive-compulsive disorder, unspecified type  Plan:  -meet again on Thursday, June 22, 2023 at [facility]

## 2023-06-12 ENCOUNTER — Encounter: Payer: Self-pay | Admitting: Professional

## 2023-06-22 ENCOUNTER — Encounter: Payer: Self-pay | Admitting: Professional

## 2023-06-22 ENCOUNTER — Ambulatory Visit: Payer: 59 | Admitting: Professional

## 2023-06-22 DIAGNOSIS — F321 Major depressive disorder, single episode, moderate: Secondary | ICD-10-CM | POA: Diagnosis not present

## 2023-06-22 DIAGNOSIS — F429 Obsessive-compulsive disorder, unspecified: Secondary | ICD-10-CM

## 2023-06-22 DIAGNOSIS — F419 Anxiety disorder, unspecified: Secondary | ICD-10-CM

## 2023-06-22 NOTE — Progress Notes (Signed)
 [facility] Counselor/Therapist Progress Note  Patient ID: Jerry Cisneros, MRN: [id number],    Date: 06/22/2023  Time Spent: 49 minutes [id number]  Subjective: The patient arrived on time for his in-person appointment.  Issues addressed: 1-MH-IOP at [facility] -initiated referral while in session due to increased difficulties pt is experiencing 2-mood -depressed and anxious -has spiked in past two days since his father had a major panic attack -PHQ-9    06/22/2023    9:29 AM 02/16/2023    3:53 PM 11/23/2022    3:10 PM  Depression screen PHQ 2/9  Decreased Interest 1 0 1  Down, Depressed, Hopeless 3 1   PHQ - 2 Score 4 1 1   Altered sleeping 3 1   Tired, decreased energy 3 3   Change in appetite 3 3   Feeling bad or failure about yourself  3 1   Trouble concentrating 0 1   Moving slowly or fidgety/restless 1 1   Suicidal thoughts 1 0   PHQ-9 Score 18 11   Difficult doing work/chores Extremely dIfficult Somewhat difficult   3-school -he is performing poorly with his new classes and cannot get the work done -his professor has reached out to him to discuss, however, he has not returned contact -pt feels stuck and unable to move forward -he lacks any structure at this time and admits that attending classes inperson are his best option moving forward 4-family -feels alone and unable to discuss his issues -wants badly to move out due to barriers living with his parents present for him -pt feels responsible for his father's MH and will meet father's needs before his own  Treatment Plan Problems Addressed  Anxiety, Eating Disorders And Obesity, Family Conflict, Unipolar Depression Goals 1. Alleviate depressive symptoms and return to previous level of effective functioning. Objective Describe current and past experiences with depression including their impact on functioning and attempts to resolve it. Target Date: 2024-04-05 Frequency: biweekly  Progress:  100 Modality: individual  Related Interventions Encourage the client to share his/her thoughts and feelings of depression; express empathy and build rapport while identifying primary cognitive, behavioral, interpersonal, or other contributors to depression. Assess current and past mood episodes including their features, frequency, severity, and duration (e.g., clinical interview supplemented by the Inventory to Diagnose Depression). Objective Identify and replace thoughts and beliefs that support depression. Target Date: 2024-04-05 Frequency: biweekly  Progress: 70 Modality: individual  Related Interventions Conduct Cognitive-Behavioral Therapy (see Cognitive Behavior Therapy by Kerrie Peek; Overcoming Depression by Duke Gibbons al.), beginning with helping the client learn the connection among cognition, depressive feelings, and actions. Assign the client to self-monitor thoughts, feelings, and actions in daily journal (e.g., "Negative Thoughts Trigger Negative Feelings" in the Adult Psychotherapy Homework Planner by Beacher Bottoms; "Daily Record of Dysfunctional Thoughts" in Cognitive Therapy of Depression by Bolling Bushy and Roselle Conner); process the journal material to challenge depressive thinking patterns and replace them with reality-based thoughts. Assign "behavioral experiments" in which depressive automatic thoughts are treated as hypotheses/prediction, reality-based alternative hypotheses/prediction are generated, and both are tested against the client's past, present, and/or future experiences. Facilitate and reinforce the client's shift from biased depressive self-talk and beliefs to reality-based cognitive messages that enhance self-confidence and increase adaptive actions (see "Positive Self-Talk" in the Adult Psychotherapy Homework Planner by Beacher Bottoms). Objective Learn and implement behavioral strategies to overcome depression. Target Date: 2024-04-05 Frequency: biweekly  Progress: 40 Modality:  individual  Related Interventions Engage the client in "behavioral activation," increasing his/her activity level and contact  with sources of reward, while identifying processes that inhibit activation (see Behavioral Activation for Depression by Lynette Saras, Dimidjian, and Herman-Dunn; or assign "Identify and Schedule Pleasant Activities" in the Adult Psychotherapy Homework Planner by [facility]); use behavioral techniques such as instruction, rehearsal, role-playing, role reversal, as needed, to facilitate activity in the client's daily life; reinforce success. Assist the client in developing skills that increase the likelihood of deriving pleasure from behavioral activation (e.g., assertiveness skills, developing an exercise plan, less internal/more external focus, increased social involvement); reinforce success. Objective Verbalize an understanding and resolution of current interpersonal problems. Target Date: 2024-04-05 Frequency: biweekly  Progress: 60 Modality: individual  Related Interventions For role transitions (e.g., beginning or ending a relationship or career, moving, promotion, retirement, graduation), help the client mourn the loss of the old role while recognizing positive and negative aspects of the new role, and taking steps to gain mastery over the new role. Objective Learn and implement problem-solving and decision-making skills. Target Date: 2024-04-05 Frequency: biweekly  Progress: 40 Modality: individual  Objective Learn and implement conflict resolution skills to resolve interpersonal problems. Target Date: 2024-04-05 Frequency: biweekly  Progress: 50 Modality: individual  Related Interventions Teach conflict resolution skills (e.g., empathy, active listening, "I messages," respectful communication, assertiveness without aggression, compromise); use psychoeducation, modeling, role-playing, and rehearsal to work through several current conflicts; assign homework exercises; review  and repeat so as to integrate their use into the client's life. 2. Appropriately grieve the loss in order to normalize mood and to return to previously adaptive level of functioning. 3. Decrease the level of present conflict with parents while beginning to let go of or resolving past conflicts with them. Objective Describe the conflicts and the causes of conflicts between self and parents. Target Date: 2024-04-05 Frequency: biweekly  Progress: 80 Modality: individual  Related Interventions Give verbal permission for the client to have and express own feelings, thoughts, and perspectives in order to foster a sense of autonomy from family. Explore the nature of the client's family conflicts and their perceived causes. Objective Identify own as well as others' role in the family conflicts. Target Date: 2024-04-05 Frequency: biweekly  Progress: 40 Modality: individual  Related Interventions Confront the client when he/she is not taking responsibility for his/her role in the family conflict and reinforce the client for owning responsibility for his/her contribution to the conflict. Objective Increase the level of independent functioning. Target Date: 2024-04-05 Frequency: biweekly  Progress: 50 Modality: individual  Related Interventions Confront the client's emotional dependence and avoidance of economic responsibility that promotes continuing pattern of living with parents; develop a plan for the client's healthy and responsible emancipation from parents that is, if possible, complete with their blessing (e.g., finding and keeping a job, saving money, socializing with friends, finding own housing, etc.). Objective Report an increase in resolving conflicts with parents by talking calmly and assertively rather than aggressively and defensively. Target Date: 2024-04-05 Frequency: biweekly  Progress: 30 Modality: individual  Related Interventions Use role-playing, role reversal, modeling, and  behavioral rehearsal to help the client develop assertive ways to resolve conflict with parents (recommend Your Perfect Right: Assertiveness and Equality in Your Life and Relationships by Angelo Barge). 4. Develop coping strategies (e.g., feeling identification, problem-solving, assertiveness) to address emotional issues that could lead to relapse of the eating disorder. 5. Develop healthy cognitive patterns and beliefs about self that lead to positive identity and prevent a relapse of the eating disorder. 6. Develop healthy interpersonal relationships that lead to alleviation and help prevent  the relapse of the eating disorder. 7. Develop healthy interpersonal relationships that lead to the alleviation and help prevent the relapse of depression. 8. Develop healthy thinking patterns and beliefs about self, others, and the world that lead to the alleviation and help prevent the relapse of depression. 9. Enhance ability to effectively cope with the full variety of life's worries and anxieties. 10. Learn and implement coping skills that result in a reduction of anxiety and worry, and improved daily functioning. 11. Reach a level of reduced tension, increased satisfaction, and improved communication with family and/or other authority figures. 12. Recognize, accept, and cope with feelings of depression. 13. Restore normal eating patterns, healthy weight maintenance, and a realistic appraisal of body size. Objective Honestly describe the pattern of eating including types, amounts, and frequency of food consumed or hoarded. Target Date: 2024-04-05 Frequency: biweekly  Progress: 70 Modality: individual  Related Interventions Assess the historical course of the disorder including the amount, type, and pattern of the client's food intake (e.g., too little food, too much food, binge eating, or hoarding food); perceived personal and interpersonal triggers and personal goals. Objective Establish regular  eating patterns by eating at regular intervals and consuming optimal daily calories. Target Date: 2024-04-05 Frequency: biweekly  Progress: 50 Modality: individual  Related Interventions Establish healthy weight goals for the client per the Body Mass Index (BMI), the [employer] Height and Weight Tables, or some other recognized standard. Objective Learn and implement skills for managing urges to engage in unhealthy eating or weight loss practices. Target Date: 2024-04-05 Frequency: biweekly  Progress: 50 Modality: individual  Related Interventions Teach the client tailored skills to manage high-risk situations including distraction, positive self-talk, problem-solving, conflict resolution (e.g., empathy, active listening, "I messages," respectful communication, assertiveness without aggression, compromise), or other social/ communication skills; use modeling, role-playing, and behavior rehearsal to work through several current situations. Objective State a basis for positive identity that is not based on weight and appearance but on character, traits, relationships, and intrinsic value. Target Date: 2024-04-05 Frequency: biweekly  Progress: 60 Modality: individual  Related Interventions Assist the client in identifying a basis for self-worth apart from body image by reviewing his/her talents, successes, positive traits, importance to others, and intrinsic spiritual value. Objective Verbalize an understanding of relapse prevention and the distinction between a lapse and a relapse. Target Date: 2024-04-05 Frequency: biweekly  Progress: 30 Modality: individual  Related Interventions Discuss with the client the distinction between a lapse and relapse, associating a lapse with an initial and reversible return of distress, urges, or to avoid, and relapse with the decision to return to the cycle of maladaptive thoughts and actions (e.g., feeling anxious, binging, then purging). Identify with the client  future situations or circumstances in which lapses could occur. 14. Stabilize anxiety level while increasing ability to function on a daily basis. Objective Learn and implement problem-solving strategies for realistically addressing worries. Target Date: 2024-04-05 Frequency: biweekly  Progress: 30 Modality: individual  Related Interventions Teach the client problem-solving strategies involving specifically defining a problem, generating options for addressing it, evaluating the pros and cons of each option, selecting and implementing an optional action, and reevaluating and refining the action (or assign "Applying Problem-Solving to Interpersonal Conflict" in the Adult Psychotherapy Homework Planner by Beacher Bottoms). Objective Identify, challenge, and replace biased, fearful self-talk with positive, realistic, and empowering self-talk. Target Date: 2024-04-05 Frequency: biweekly  Progress: 50 Modality: individual  Related Interventions Explore the client's schema and self-talk that mediate his/her fear response; assist him/her in  challenging the biases; replace the distorted messages with reality-based alternatives and positive, realistic self-talk that will increase his/her self-confidence in coping with irrational fears (see Cognitive Therapy of Anxiety Disorders by Anderson Kaufman). Assign the client a homework exercise in which he/she identifies fearful self-talk, identifies biases in the self-talk, generates alternatives, and tests through behavioral experiments (or assign "Negative Thoughts Trigger Negative Feelings" in the Adult Psychotherapy Homework Planner by [facility]); review and reinforce success, providing corrective feedback toward improvement. Objective Learn to accept limitations in life and commit to tolerating, rather than avoiding, unpleasant emotions while accomplishing meaningful goals. Target Date: 2024-04-05 Frequency: biweekly  Progress: 40 Modality: individual  Related  Interventions Use techniques from Acceptance and Commitment Therapy to help client accept uncomfortable realities such as lack of complete control, imperfections, and uncertainty and tolerate unpleasant emotions and thoughts in order to accomplish value-consistent goals. Objective Learn and implement personal and interpersonal skills to reduce anxiety and improve interpersonal relationships. Target Date: 2024-04-05 Frequency: biweekly  Progress: 60 Modality: individual  Related Interventions Use instruction, modeling, and role-playing to build the client's general social, communication, and/or conflict resolution skills. Objective Maintain involvement in work, family, and social activities. Target Date: 2024-04-05 Frequency: biweekly  Progress: 60 Modality: individual  Related Interventions Support the client in following through with work, family, and social activities rather than escaping or avoiding them to focus on anxiety. 15. Terminate overeating and implement lifestyle changes that lead to weight loss and improved health. 16. Terminate the pattern of binge eating and purging behavior with a return to eating normal amounts of nutritious foods.  Diagnosis:No diagnosis found.  Plan:  -meet again on Thursday, June 22, 2023 at [facility]               [place], [facility]

## 2023-07-06 ENCOUNTER — Ambulatory Visit: Admitting: Professional

## 2023-07-20 ENCOUNTER — Ambulatory Visit: Payer: Self-pay | Admitting: Professional

## 2023-08-03 ENCOUNTER — Ambulatory Visit: Payer: Self-pay | Admitting: Professional

## 2023-08-11 ENCOUNTER — Telehealth (HOSPITAL_COMMUNITY): Payer: Self-pay | Admitting: *Deleted

## 2023-08-14 NOTE — Telephone Encounter (Signed)
 Looking thru med request

## 2023-08-15 ENCOUNTER — Encounter (HOSPITAL_COMMUNITY): Payer: Self-pay | Admitting: Psychiatry

## 2023-08-15 ENCOUNTER — Ambulatory Visit (INDEPENDENT_AMBULATORY_CARE_PROVIDER_SITE_OTHER): Admitting: Psychiatry

## 2023-08-15 VITALS — BP 134/98 | HR 99 | Ht 68.0 in | Wt 256.0 lb

## 2023-08-15 DIAGNOSIS — F419 Anxiety disorder, unspecified: Secondary | ICD-10-CM | POA: Diagnosis not present

## 2023-08-15 DIAGNOSIS — F321 Major depressive disorder, single episode, moderate: Secondary | ICD-10-CM | POA: Diagnosis not present

## 2023-08-15 DIAGNOSIS — F429 Obsessive-compulsive disorder, unspecified: Secondary | ICD-10-CM

## 2023-08-15 MED ORDER — HYDROXYZINE HCL 10 MG PO TABS: 10.0000 mg | ORAL_TABLET | Freq: Two times a day (BID) | ORAL | 1 refills | Status: DC | PRN

## 2023-08-15 MED ORDER — BUPROPION HCL ER (XL) 150 MG PO TB24: 150.0000 mg | ORAL_TABLET | Freq: Every day | ORAL | 1 refills | Status: DC

## 2023-08-15 NOTE — Progress Notes (Signed)
 [facility] Follow up visit  Patient Identification: Jerry Cisneros MRN:  [id number] Date of Evaluation:  08/15/2023 Referral Source: primary care Chief Complaint:   Chief Complaint  Patient presents with   Follow-up  Depression  Visit Diagnosis:    ICD-10-CM   1. MDD (major depressive disorder), single episode, moderate (HCC)  F32.1     2. Obsessive-compulsive disorder, unspecified type  F42.9     3. Anxiety  F41.9       History of Present Illness: Patient is a 23 years old white male living with his parents and younger brother initially referred by primary care physician to establish care he is seeing therapist Devera Fluke referred for assessment for of depression and anxiety and OCD.  Has started community college but was stressed had to cut down a subject. Last visit  prozac  was increased Apparently still had depression and joined PHP and then IOP. Med adjusted to changing to wellbutrin and not on prozac  now.  On vistaril for anxiety Feels better and using coping skills , still gets stressed and takes vistaril one or two at times Less obsessed of eating  Doing mindful eating   Aggravating factors:  difficult growing up  , religious and strict parents, obsessive thoughts of eating, Modifying factors: online chats ,online friends  Duration since age 59 Severity: better or manageable   Past Psychiatric History: depression, anxiety   Previous Psychotropic Medications:   Substance Abuse History in the last 12 months:  No.  Consequences of Substance Abuse: NA  Past Medical History: History reviewed. No pertinent past medical history.  Past Surgical History:  Procedure Laterality Date   NO PAST SURGERIES      Family Psychiatric History: dad questionable: mom possible depression, one uncle many hospital admissions. Diagnosis not known  Family History:  Family History  Problem Relation Age of Onset   Psoriasis Father     Social History:   Social History    Socioeconomic History   Marital status: Single    Spouse name: Not on file   Number of children: Not on file   Years of education: Not on file   Highest education level: Some college, no degree  Occupational History   Occupation: Consulting civil engineer  Tobacco Use   Smoking status: Never   Smokeless tobacco: Never  Substance and Sexual Activity   Alcohol use: Not on file   Drug use: Not on file   Sexual activity: Not on file  Other Topics Concern   Not on file  Social History Narrative   Mother is from [place] originally.    Social Drivers of Corporate investment banker Strain: Low Risk  (02/21/2023)   Overall Financial Resource Strain (CARDIA)    Difficulty of Paying Living Expenses: Not hard at all  Food Insecurity: No Food Insecurity (02/21/2023)   Hunger Vital Sign    Worried About Running Out of Food in the Last Year: Never true    Ran Out of Food in the Last Year: Never true  Transportation Needs: No Transportation Needs (02/21/2023)   PRAPARE - Administrator, Civil Service (Medical): No    Lack of Transportation (Non-Medical): No  Physical Activity: Sufficiently Active (02/21/2023)   Exercise Vital Sign    Days of Exercise per Week: 5 days    Minutes of Exercise per Session: 150+ min  Stress: No Stress Concern Present (02/21/2023)   [employer] of Occupational Health - Occupational Stress Questionnaire    Feeling of Stress : Only  a little  Social Connections: Moderately Isolated (02/21/2023)   Social Connection and Isolation Panel [NHANES]    Frequency of Communication with Friends and Family: Never    Frequency of Social Gatherings with Friends and Family: Once a week    Attends Religious Services: More than 4 times per year    Active Member of [employer] or Organizations: Yes    Attends Engineer, structural: More than 4 times per year    Marital Status: Never married    Additional Social History: grew up with parents and young sibling brother.  Strict controlling family, reglegious, had to go to church everything was sin if watch tv or earthly related. Difficult growing up  Allergies:   Allergies  Allergen Reactions   Erythromycin     Upset stomach    Metabolic Disorder Labs: No results found for: "HGBA1C", "MPG" No results found for: "PROLACTIN" Lab Results  Component Value Date   CHOL 162 12/31/2021   TRIG 54 12/31/2021   HDL 51 12/31/2021   CHOLHDL 3.2 12/31/2021   LDLCALC 97 12/31/2021   LDLCALC 81 01/10/2019   Lab Results  Component Value Date   TSH 3.070 02/21/2023    Therapeutic Level Labs: No results found for: "LITHIUM" No results found for: "CBMZ" No results found for: "VALPROATE"  Current Medications: Current Outpatient Medications  Medication Sig Dispense Refill   buPROPion (WELLBUTRIN XL) 150 MG 24 hr tablet Take 150 mg by mouth daily.     hydrOXYzine (ATARAX) 10 MG tablet Take 10 mg by mouth 3 (three) times daily as needed.     No current facility-administered medications for this visit.     Psychiatric Specialty Exam: Review of Systems  Cardiovascular:  Negative for chest pain.  Neurological:  Negative for tremors.  Psychiatric/Behavioral:  Negative for agitation.     Blood pressure (!) 134/98, pulse 99, height 5\' 8"  (1.727 m), weight 256 lb (116.1 kg).Body mass index is 38.92 kg/m.  General Appearance: Casual  Eye Contact:  Fair  Speech:  Clear and Coherent  Volume:  Normal  Mood: fair  Affect:  Constricted  Thought Process:  Goal Directed  Orientation:  Full (Time, Place, and Person)  Thought Content:  Obsessions and Rumination  Suicidal Thoughts:  No  Homicidal Thoughts:  No  Memory:  Immediate;   Fair  Judgement:  Fair  Insight:  Shallow  Psychomotor Activity:  Normal  Concentration:  Concentration: Fair  Recall:  [employer] of Knowledge:Good  Language: Fair  Akathisia:  No  Handed:    AIMS (if indicated):  not done  Assets:  Desire for Improvement Housing Physical  Health  ADL's:  Intact  Cognition: WNL  Sleep:  Fair   Screenings: GAD-7    Advertising copywriter from 11/15/2022 in [facility] Behavioral Medicine at [employer] Counselor from 08/04/2022 in [facility] Behavioral Medicine at [employer] Counselor from 02/10/2022 in [facility] Behavioral Medicine at [facility] Office Visit from 05/18/2021 in [facility] Primary Care & Sports Medicine at [facility] Office Visit from 01/14/2019 in [facility] Primary Care & Sports Medicine at [facility]  Total GAD-7 Score 10 8 9 10 4       PHQ2-9    Flowsheet Row Counselor from 06/22/2023 in [facility] Behavioral Medicine at [facility] Counselor from 02/16/2023 in [facility] Behavioral Medicine at [facility] Counselor from 11/23/2022 in [facility] Behavioral Medicine at [facility] Office Visit from 09/20/2022  in [facility] Behavioral Health at [facility] Counselor from 08/04/2022 in [facility] Behavioral Medicine at [facility]  PHQ-2 Total Score 4 1 1 2 3   PHQ-9 Total Score 18 11 -- 16 17      SBQ-R    Flowsheet Row Counselor from 06/22/2023 in [facility] Behavioral Medicine at [employer] Counselor from 08/04/2022 in [facility] Behavioral Medicine at [facility]  SBQ-R Total Score 10 8      Flowsheet Row Office Visit from 01/31/2023 in [facility] Behavioral Health at [facility] Office Visit from 09/20/2022 in [facility] Outpatient Behavioral Health at [facility]  C-SSRS RISK CATEGORY No Risk Error: Q3, 4, or 5 should not be populated when Q2 is No       Assessment and Plan: as follows    Prior documentation reviewed    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 Not on prozac ,   Continue to monitor and vistaril for anxiety Follow up with PCP in regard to any concern and monitor BP , denies palpitations  Direct care time spent  20 minutes  with chart review, documentation elaboration and face-to-face Collaboration of Care: Primary Care Provider AEB notes and chart reviewed  Patient/Guardian was advised Release of Information must be obtained prior to any record release in order to collaborate their care with an outside provider. Patient/Guardian was advised if they have not already done so to contact the registration department to sign all necessary forms in order for us  to release information regarding their care.   Consent: Patient/Guardian gives verbal consent for treatment and assignment of benefits for services provided during this visit. Patient/Guardian expressed understanding and agreed to proceed.   Wray Heady, MD 6/10/202510:38 AM

## 2023-08-31 ENCOUNTER — Ambulatory Visit: Payer: Self-pay | Admitting: Professional

## 2023-09-14 ENCOUNTER — Encounter: Payer: Self-pay | Admitting: Professional

## 2023-09-14 ENCOUNTER — Ambulatory Visit (INDEPENDENT_AMBULATORY_CARE_PROVIDER_SITE_OTHER): Admitting: Professional

## 2023-09-14 DIAGNOSIS — F321 Major depressive disorder, single episode, moderate: Secondary | ICD-10-CM

## 2023-09-14 DIAGNOSIS — F429 Obsessive-compulsive disorder, unspecified: Secondary | ICD-10-CM

## 2023-09-14 NOTE — Progress Notes (Signed)
 [facility] Counselor/Therapist Progress Note  Patient ID: Jerry Cisneros, MRN: [id number],    Date: 09/14/2023  Time Spent: 48 minutes 9-948am  Subjective: The patient arrived on time for his in-person appointment.  Issues addressed: 1-MH-IOP at [facility] -finished program mid June -left program feeling healthy for most part 2-social -since then he has been able to socialize with people -he has gone new places and regularly talking with people -he did go to [employer] -he has been playing pickle ball 3-online relationship -not talking as much right now -he was getting weird feeling and I wasn't really sure -he was having feelings of resentment toward them -not understanding where his feelings were coming from 4-college -has been taking summer classes and was doing well -depression, shame, and not managing time well -he ended up doing poorly and missing assignments -felt too ashamed to do anything to address issues -he would go to campus and then be too anxious to attend -he wanted to schedule appointments with advisor 5-nutrition -pt bragged on himself and his improvement in decreased food obsession -he feels good about managing his intrusive thoughts -he has been seeing a nutritionist -he has not been having the cravings -he has been using intuitive eating 6-considering trip to [place] for mom's family reunion -unsure if he wants to attend, doesn't really know her family -mom pressuring him to do so given her mother's advanced age -pt considering a trip to [place] to visit an online friend -pt considering securing his (dual) citizenship [place]  Treatment Plan Problems Addressed  Anxiety, Eating Disorders And Obesity, Family Conflict, Unipolar Depression Goals 1. Alleviate depressive symptoms and return to previous level of effective functioning. Objective Describe current and past experiences with depression including  their impact on functioning and attempts to resolve it. Target Date: 2024-04-05 Frequency: biweekly  Progress: 100 Modality: individual  Related Interventions Encourage the client to share his/her thoughts and feelings of depression; express empathy and build rapport while identifying primary cognitive, behavioral, interpersonal, or other contributors to depression. Assess current and past mood episodes including their features, frequency, severity, and duration (e.g., clinical interview supplemented by the Inventory to Diagnose Depression). Objective Identify and replace thoughts and beliefs that support depression. Target Date: 2024-04-05 Frequency: biweekly  Progress: 70 Modality: individual  Related Interventions Conduct Cognitive-Behavioral Therapy (see Cognitive Behavior Therapy by Almarie; Overcoming Depression by Marine dunker al.), beginning with helping the client learn the connection among cognition, depressive feelings, and actions. Assign the client to self-monitor thoughts, feelings, and actions in daily journal (e.g., Negative Thoughts Trigger Negative Feelings in the Adult Psychotherapy Homework Planner by Jenniffer; Daily Record of Dysfunctional Thoughts in Cognitive Therapy of Depression by Almarie Candida Gentry and Shona); process the journal material to challenge depressive thinking patterns and replace them with reality-based thoughts. Assign behavioral experiments in which depressive automatic thoughts are treated as hypotheses/prediction, reality-based alternative hypotheses/prediction are generated, and both are tested against the client's past, present, and/or future experiences. Facilitate and reinforce the client's shift from biased depressive self-talk and beliefs to reality-based cognitive messages that enhance self-confidence and increase adaptive actions (see Positive Self-Talk in the Adult Psychotherapy Homework Planner by Jenniffer). Objective Learn and implement behavioral  strategies to overcome depression. Target Date: 2024-04-05 Frequency: biweekly  Progress: 40 Modality: individual  Related Interventions Engage the client in behavioral activation, increasing his/her activity level and contact with sources of reward, while identifying processes that inhibit activation (see Behavioral Activation for Depression by Loleta, Dimidjian, and Herman-Dunn; or  assign Identify and Schedule Pleasant Activities in the Adult Psychotherapy Homework Planner by [facility]); use behavioral techniques such as instruction, rehearsal, role-playing, role reversal, as needed, to facilitate activity in the client's daily life; reinforce success. Assist the client in developing skills that increase the likelihood of deriving pleasure from behavioral activation (e.g., assertiveness skills, developing an exercise plan, less internal/more external focus, increased social involvement); reinforce success. Objective Verbalize an understanding and resolution of current interpersonal problems. Target Date: 2024-04-05 Frequency: biweekly  Progress: 60 Modality: individual  Related Interventions For role transitions (e.g., beginning or ending a relationship or career, moving, promotion, retirement, graduation), help the client mourn the loss of the old role while recognizing positive and negative aspects of the new role, and taking steps to gain mastery over the new role. Objective Learn and implement problem-solving and decision-making skills. Target Date: 2024-04-05 Frequency: biweekly  Progress: 40 Modality: individual  Objective Learn and implement conflict resolution skills to resolve interpersonal problems. Target Date: 2024-04-05 Frequency: biweekly  Progress: 50 Modality: individual  Related Interventions Teach conflict resolution skills (e.g., empathy, active listening, I messages, respectful communication, assertiveness without aggression, compromise); use psychoeducation, modeling,  role-playing, and rehearsal to work through several current conflicts; assign homework exercises; review and repeat so as to integrate their use into the client's life. 2. Appropriately grieve the loss in order to normalize mood and to return to previously adaptive level of functioning. 3. Decrease the level of present conflict with parents while beginning to let go of or resolving past conflicts with them. Objective Describe the conflicts and the causes of conflicts between self and parents. Target Date: 2024-04-05 Frequency: biweekly  Progress: 80 Modality: individual  Related Interventions Give verbal permission for the client to have and express own feelings, thoughts, and perspectives in order to foster a sense of autonomy from family. Explore the nature of the client's family conflicts and their perceived causes. Objective Identify own as well as others' role in the family conflicts. Target Date: 2024-04-05 Frequency: biweekly  Progress: 40 Modality: individual  Related Interventions Confront the client when he/she is not taking responsibility for his/her role in the family conflict and reinforce the client for owning responsibility for his/her contribution to the conflict. Objective Increase the level of independent functioning. Target Date: 2024-04-05 Frequency: biweekly  Progress: 50 Modality: individual  Related Interventions Confront the client's emotional dependence and avoidance of economic responsibility that promotes continuing pattern of living with parents; develop a plan for the client's healthy and responsible emancipation from parents that is, if possible, complete with their blessing (e.g., finding and keeping a job, saving money, socializing with friends, finding own housing, etc.). Objective Report an increase in resolving conflicts with parents by talking calmly and assertively rather than aggressively and defensively. Target Date: 2024-04-05 Frequency: biweekly   Progress: 30 Modality: individual  Related Interventions Use role-playing, role reversal, modeling, and behavioral rehearsal to help the client develop assertive ways to resolve conflict with parents (recommend Your Perfect Right: Assertiveness and Equality in Your Life and Relationships by Darrelyn armin Sick). 4. Develop coping strategies (e.g., feeling identification, problem-solving, assertiveness) to address emotional issues that could lead to relapse of the eating disorder. 5. Develop healthy cognitive patterns and beliefs about self that lead to positive identity and prevent a relapse of the eating disorder. 6. Develop healthy interpersonal relationships that lead to alleviation and help prevent the relapse of the eating disorder. 7. Develop healthy interpersonal relationships that lead to the alleviation and help prevent the relapse  of depression. 8. Develop healthy thinking patterns and beliefs about self, others, and the world that lead to the alleviation and help prevent the relapse of depression. 9. Enhance ability to effectively cope with the full variety of life's worries and anxieties. 10. Learn and implement coping skills that result in a reduction of anxiety and worry, and improved daily functioning. 11. Reach a level of reduced tension, increased satisfaction, and improved communication with family and/or other authority figures. 12. Recognize, accept, and cope with feelings of depression. 13. Restore normal eating patterns, healthy weight maintenance, and a realistic appraisal of body size. Objective Honestly describe the pattern of eating including types, amounts, and frequency of food consumed or hoarded. Target Date: 2024-04-05 Frequency: biweekly  Progress: 70 Modality: individual  Related Interventions Assess the historical course of the disorder including the amount, type, and pattern of the client's food intake (e.g., too little food, too much food, binge eating, or  hoarding food); perceived personal and interpersonal triggers and personal goals. Objective Establish regular eating patterns by eating at regular intervals and consuming optimal daily calories. Target Date: 2024-04-05 Frequency: biweekly  Progress: 50 Modality: individual  Related Interventions Establish healthy weight goals for the client per the Body Mass Index (BMI), the [place] Height and Weight Tables, or some other recognized standard. Objective Learn and implement skills for managing urges to engage in unhealthy eating or weight loss practices. Target Date: 2024-04-05 Frequency: biweekly  Progress: 50 Modality: individual  Related Interventions Teach the client tailored skills to manage high-risk situations including distraction, positive self-talk, problem-solving, conflict resolution (e.g., empathy, active listening, I messages, respectful communication, assertiveness without aggression, compromise), or other social/ communication skills; use modeling, role-playing, and behavior rehearsal to work through several current situations. Objective State a basis for positive identity that is not based on weight and appearance but on character, traits, relationships, and intrinsic value. Target Date: 2024-04-05 Frequency: biweekly  Progress: 60 Modality: individual  Related Interventions Assist the client in identifying a basis for self-worth apart from body image by reviewing his/her talents, successes, positive traits, importance to others, and intrinsic spiritual value. Objective Verbalize an understanding of relapse prevention and the distinction between a lapse and a relapse. Target Date: 2024-04-05 Frequency: biweekly  Progress: 30 Modality: individual  Related Interventions Discuss with the client the distinction between a lapse and relapse, associating a lapse with an initial and reversible return of distress, urges, or to avoid, and relapse with the decision to return to the  cycle of maladaptive thoughts and actions (e.g., feeling anxious, binging, then purging). Identify with the client future situations or circumstances in which lapses could occur. 14. Stabilize anxiety level while increasing ability to function on a daily basis. Objective Learn and implement problem-solving strategies for realistically addressing worries. Target Date: 2024-04-05 Frequency: biweekly  Progress: 30 Modality: individual  Related Interventions Teach the client problem-solving strategies involving specifically defining a problem, generating options for addressing it, evaluating the pros and cons of each option, selecting and implementing an optional action, and reevaluating and refining the action (or assign Applying Problem-Solving to Interpersonal Conflict in the Adult Psychotherapy Homework Planner by Jenniffer). Objective Identify, challenge, and replace biased, fearful self-talk with positive, realistic, and empowering self-talk. Target Date: 2024-04-05 Frequency: biweekly  Progress: 50 Modality: individual  Related Interventions Explore the client's schema and self-talk that mediate his/her fear response; assist him/her in challenging the biases; replace the distorted messages with reality-based alternatives and positive, realistic self-talk that will increase his/her self-confidence in coping  with irrational fears (see Cognitive Therapy of Anxiety Disorders by Gretta armin Mon). Assign the client a homework exercise in which he/she identifies fearful self-talk, identifies biases in the self-talk, generates alternatives, and tests through behavioral experiments (or assign Negative Thoughts Trigger Negative Feelings in the Adult Psychotherapy Homework Planner by [facility]); review and reinforce success, providing corrective feedback toward improvement. Objective Learn to accept limitations in life and commit to tolerating, rather than avoiding, unpleasant emotions while accomplishing  meaningful goals. Target Date: 2024-04-05 Frequency: biweekly  Progress: 40 Modality: individual  Related Interventions Use techniques from Acceptance and Commitment Therapy to help client accept uncomfortable realities such as lack of complete control, imperfections, and uncertainty and tolerate unpleasant emotions and thoughts in order to accomplish value-consistent goals. Objective Learn and implement personal and interpersonal skills to reduce anxiety and improve interpersonal relationships. Target Date: 2024-04-05 Frequency: biweekly  Progress: 60 Modality: individual  Related Interventions Use instruction, modeling, and role-playing to build the client's general social, communication, and/or conflict resolution skills. Objective Maintain involvement in work, family, and social activities. Target Date: 2024-04-05 Frequency: biweekly  Progress: 60 Modality: individual  Related Interventions Support the client in following through with work, family, and social activities rather than escaping or avoiding them to focus on anxiety. 15. Terminate overeating and implement lifestyle changes that lead to weight loss and improved health. 16. Terminate the pattern of binge eating and purging behavior with a return to eating normal amounts of nutritious foods.  Diagnosis:MDD (major depressive disorder), single episode, moderate (HCC)  Obsessive-compulsive disorder, unspecified type  Plan:  -pt to contact his insurance company regarding ERP therapists and secure appointment -meet again on Thursday, September 28, [place]23 [email or web address]

## 2023-09-19 ENCOUNTER — Ambulatory Visit (HOSPITAL_COMMUNITY): Payer: Self-pay | Admitting: Psychiatry

## 2023-09-28 ENCOUNTER — Ambulatory Visit: Admitting: Professional

## 2023-09-28 ENCOUNTER — Encounter: Payer: Self-pay | Admitting: Professional

## 2023-09-28 DIAGNOSIS — F321 Major depressive disorder, single episode, moderate: Secondary | ICD-10-CM

## 2023-09-28 DIAGNOSIS — F429 Obsessive-compulsive disorder, unspecified: Secondary | ICD-10-CM

## 2023-09-28 NOTE — Progress Notes (Unsigned)
 [facility] Counselor/Therapist Progress Note  Patient ID: Jerry Cisneros, MRN: [id number],    Date: 09/28/2023  Time Spent: 43 minutes [id number]  Subjective: The patient arrived on time for his in-person appointment.  Issues addressed: 1-homework- did not complete -contact insurance to determine what ERP therapists are in network 2-mental health -he is struggling with lack of follow through on school and appointment commitments -he admits coming and sitting in park lot today and contemplating leaving and calling to say he was sick -pt talked himself in to following through with the appointment -pt admits he is experiencing increased shame for not following through with his commitments -he acknowledges fear of failure -discussed self-sabotage and pt admitted affirmatively 3-problem solving -ask brother for support to follow through -not comfortable asking friends or his parents 4-structuring time to help him follow through -talked with pt about creating a daily schedule to help him stay ative -pt identified his plan to leave our appt and go to contact [employer] to discuss options for ERP, he then stated he will go to [employer] to seek support via student services  Treatment Plan Problems Addressed  Anxiety, Eating Disorders And Obesity, Family Conflict, Unipolar Depression Goals 1. Alleviate depressive symptoms and return to previous level of effective functioning. Objective Describe current and past experiences with depression including their impact on functioning and attempts to resolve it. Target Date: 2024-04-05 Frequency: biweekly  Progress: 100 Modality: individual  Related Interventions Encourage the client to share his/her thoughts and feelings of depression; express empathy and build rapport while identifying primary cognitive, behavioral, interpersonal, or other contributors to depression. Assess current and past mood episodes including their  features, frequency, severity, and duration (e.g., clinical interview supplemented by the Inventory to Diagnose Depression). Objective Identify and replace thoughts and beliefs that support depression. Target Date: 2024-04-05 Frequency: biweekly  Progress: 70 Modality: individual  Related Interventions Conduct Cognitive-Behavioral Therapy (see Cognitive Behavior Therapy by Almarie; Overcoming Depression by Marine dunker al.), beginning with helping the client learn the connection among cognition, depressive feelings, and actions. Assign the client to self-monitor thoughts, feelings, and actions in daily journal (e.g., Negative Thoughts Trigger Negative Feelings in the Adult Psychotherapy Homework Planner by Jenniffer; Daily Record of Dysfunctional Thoughts in Cognitive Therapy of Depression by Almarie Candida Gentry and Shona); process the journal material to challenge depressive thinking patterns and replace them with reality-based thoughts. Assign behavioral experiments in which depressive automatic thoughts are treated as hypotheses/prediction, reality-based alternative hypotheses/prediction are generated, and both are tested against the client's past, present, and/or future experiences. Facilitate and reinforce the client's shift from biased depressive self-talk and beliefs to reality-based cognitive messages that enhance self-confidence and increase adaptive actions (see Positive Self-Talk in the Adult Psychotherapy Homework Planner by Jenniffer). Objective Learn and implement behavioral strategies to overcome depression. Target Date: 2024-04-05 Frequency: biweekly  Progress: 40 Modality: individual  Related Interventions Engage the client in behavioral activation, increasing his/her activity level and contact with sources of reward, while identifying processes that inhibit activation (see Behavioral Activation for Depression by Loleta, Dimidjian, and Herman-Dunn; or assign Identify and Schedule  Pleasant Activities in the Adult Psychotherapy Homework Planner by [facility]); use behavioral techniques such as instruction, rehearsal, role-playing, role reversal, as needed, to facilitate activity in the client's daily life; reinforce success. Assist the client in developing skills that increase the likelihood of deriving pleasure from behavioral activation (e.g., assertiveness skills, developing an exercise plan, less internal/more external focus, increased social involvement); reinforce success. Objective Verbalize an  understanding and resolution of current interpersonal problems. Target Date: 2024-04-05 Frequency: biweekly  Progress: 60 Modality: individual  Related Interventions For role transitions (e.g., beginning or ending a relationship or career, moving, promotion, retirement, graduation), help the client mourn the loss of the old role while recognizing positive and negative aspects of the new role, and taking steps to gain mastery over the new role. Objective Learn and implement problem-solving and decision-making skills. Target Date: 2024-04-05 Frequency: biweekly  Progress: 40 Modality: individual  Objective Learn and implement conflict resolution skills to resolve interpersonal problems. Target Date: 2024-04-05 Frequency: biweekly  Progress: 50 Modality: individual  Related Interventions Teach conflict resolution skills (e.g., empathy, active listening, I messages, respectful communication, assertiveness without aggression, compromise); use psychoeducation, modeling, role-playing, and rehearsal to work through several current conflicts; assign homework exercises; review and repeat so as to integrate their use into the client's life. 2. Appropriately grieve the loss in order to normalize mood and to return to previously adaptive level of functioning. 3. Decrease the level of present conflict with parents while beginning to let go of or resolving past conflicts with  them. Objective Describe the conflicts and the causes of conflicts between self and parents. Target Date: 2024-04-05 Frequency: biweekly  Progress: 80 Modality: individual  Related Interventions Give verbal permission for the client to have and express own feelings, thoughts, and perspectives in order to foster a sense of autonomy from family. Explore the nature of the client's family conflicts and their perceived causes. Objective Identify own as well as others' role in the family conflicts. Target Date: 2024-04-05 Frequency: biweekly  Progress: 40 Modality: individual  Related Interventions Confront the client when he/she is not taking responsibility for his/her role in the family conflict and reinforce the client for owning responsibility for his/her contribution to the conflict. Objective Increase the level of independent functioning. Target Date: 2024-04-05 Frequency: biweekly  Progress: 50 Modality: individual  Related Interventions Confront the client's emotional dependence and avoidance of economic responsibility that promotes continuing pattern of living with parents; develop a plan for the client's healthy and responsible emancipation from parents that is, if possible, complete with their blessing (e.g., finding and keeping a job, saving money, socializing with friends, finding own housing, etc.). Objective Report an increase in resolving conflicts with parents by talking calmly and assertively rather than aggressively and defensively. Target Date: 2024-04-05 Frequency: biweekly  Progress: 30 Modality: individual  Related Interventions Use role-playing, role reversal, modeling, and behavioral rehearsal to help the client develop assertive ways to resolve conflict with parents (recommend Your Perfect Right: Assertiveness and Equality in Your Life and Relationships by Darrelyn armin Sick). 4. Develop coping strategies (e.g., feeling identification, problem-solving, assertiveness) to  address emotional issues that could lead to relapse of the eating disorder. 5. Develop healthy cognitive patterns and beliefs about self that lead to positive identity and prevent a relapse of the eating disorder. 6. Develop healthy interpersonal relationships that lead to alleviation and help prevent the relapse of the eating disorder. 7. Develop healthy interpersonal relationships that lead to the alleviation and help prevent the relapse of depression. 8. Develop healthy thinking patterns and beliefs about self, others, and the world that lead to the alleviation and help prevent the relapse of depression. 9. Enhance ability to effectively cope with the full variety of life's worries and anxieties. 10. Learn and implement coping skills that result in a reduction of anxiety and worry, and improved daily functioning. 11. Reach a level of reduced tension, increased satisfaction, and  improved communication with family and/or other authority figures. 12. Recognize, accept, and cope with feelings of depression. 13. Restore normal eating patterns, healthy weight maintenance, and a realistic appraisal of body size. Objective Honestly describe the pattern of eating including types, amounts, and frequency of food consumed or hoarded. Target Date: 2024-04-05 Frequency: biweekly  Progress: 70 Modality: individual  Related Interventions Assess the historical course of the disorder including the amount, type, and pattern of the client's food intake (e.g., too little food, too much food, binge eating, or hoarding food); perceived personal and interpersonal triggers and personal goals. Objective Establish regular eating patterns by eating at regular intervals and consuming optimal daily calories. Target Date: 2024-04-05 Frequency: biweekly  Progress: 50 Modality: individual  Related Interventions Establish healthy weight goals for the client per the Body Mass Index (BMI), the [place] Height and Weight  Tables, or some other recognized standard. Objective Learn and implement skills for managing urges to engage in unhealthy eating or weight loss practices. Target Date: 2024-04-05 Frequency: biweekly  Progress: 50 Modality: individual  Related Interventions Teach the client tailored skills to manage high-risk situations including distraction, positive self-talk, problem-solving, conflict resolution (e.g., empathy, active listening, I messages, respectful communication, assertiveness without aggression, compromise), or other social/ communication skills; use modeling, role-playing, and behavior rehearsal to work through several current situations. Objective State a basis for positive identity that is not based on weight and appearance but on character, traits, relationships, and intrinsic value. Target Date: 2024-04-05 Frequency: biweekly  Progress: 60 Modality: individual  Related Interventions Assist the client in identifying a basis for self-worth apart from body image by reviewing his/her talents, successes, positive traits, importance to others, and intrinsic spiritual value. Objective Verbalize an understanding of relapse prevention and the distinction between a lapse and a relapse. Target Date: 2024-04-05 Frequency: biweekly  Progress: 30 Modality: individual  Related Interventions Discuss with the client the distinction between a lapse and relapse, associating a lapse with an initial and reversible return of distress, urges, or to avoid, and relapse with the decision to return to the cycle of maladaptive thoughts and actions (e.g., feeling anxious, binging, then purging). Identify with the client future situations or circumstances in which lapses could occur. 14. Stabilize anxiety level while increasing ability to function on a daily basis. Objective Learn and implement problem-solving strategies for realistically addressing worries. Target Date: 2024-04-05 Frequency: biweekly  Progress:  30 Modality: individual  Related Interventions Teach the client problem-solving strategies involving specifically defining a problem, generating options for addressing it, evaluating the pros and cons of each option, selecting and implementing an optional action, and reevaluating and refining the action (or assign Applying Problem-Solving to Interpersonal Conflict in the Adult Psychotherapy Homework Planner by Jenniffer). Objective Identify, challenge, and replace biased, fearful self-talk with positive, realistic, and empowering self-talk. Target Date: 2024-04-05 Frequency: biweekly  Progress: 50 Modality: individual  Related Interventions Explore the client's schema and self-talk that mediate his/her fear response; assist him/her in challenging the biases; replace the distorted messages with reality-based alternatives and positive, realistic self-talk that will increase his/her self-confidence in coping with irrational fears (see Cognitive Therapy of Anxiety Disorders by Gretta armin Mon). Assign the client a homework exercise in which he/she identifies fearful self-talk, identifies biases in the self-talk, generates alternatives, and tests through behavioral experiments (or assign Negative Thoughts Trigger Negative Feelings in the Adult Psychotherapy Homework Planner by [facility]); review and reinforce success, providing corrective feedback toward improvement. Objective Learn to accept limitations in life and commit to  tolerating, rather than avoiding, unpleasant emotions while accomplishing meaningful goals. Target Date: 2024-04-05 Frequency: biweekly  Progress: 40 Modality: individual  Related Interventions Use techniques from Acceptance and Commitment Therapy to help client accept uncomfortable realities such as lack of complete control, imperfections, and uncertainty and tolerate unpleasant emotions and thoughts in order to accomplish value-consistent goals. Objective Learn and implement personal  and interpersonal skills to reduce anxiety and improve interpersonal relationships. Target Date: 2024-04-05 Frequency: biweekly  Progress: 60 Modality: individual  Related Interventions Use instruction, modeling, and role-playing to build the client's general social, communication, and/or conflict resolution skills. Objective Maintain involvement in work, family, and social activities. Target Date: 2024-04-05 Frequency: biweekly  Progress: 60 Modality: individual  Related Interventions Support the client in following through with work, family, and social activities rather than escaping or avoiding them to focus on anxiety. 15. Terminate overeating and implement lifestyle changes that lead to weight loss and improved health. 16. Terminate the pattern of binge eating and purging behavior with a return to eating normal amounts of nutritious foods.  Diagnosis:MDD (major depressive disorder), single episode, moderate (HCC)  Obsessive-compulsive disorder, unspecified type  Plan:  -pt to contact his insurance company regarding ERP therapists and secure appointment -meet again on Thursday, November 02, 2023 at 4pm.

## 2023-10-30 ENCOUNTER — Other Ambulatory Visit (HOSPITAL_COMMUNITY): Payer: Self-pay | Admitting: Psychiatry

## 2023-10-30 ENCOUNTER — Telehealth (HOSPITAL_COMMUNITY): Payer: Self-pay | Admitting: Psychiatry

## 2023-10-30 MED ORDER — HYDROXYZINE HCL 10 MG PO TABS: 10.0000 mg | ORAL_TABLET | Freq: Two times a day (BID) | ORAL | 1 refills | Status: AC | PRN

## 2023-10-30 NOTE — Telephone Encounter (Signed)
 Patient left voicemail requesting refill of  hydrOXYzine  (ATARAX ) 10 MG tablet .   [id number] - [address] [place] - [address] (Ph: [phone])   Last ordered: 08/15/2023 - 60 tablets with 1 refill Last visit: 08/15/2023 Next visit: 11/02/2023

## 2023-11-02 ENCOUNTER — Encounter (HOSPITAL_COMMUNITY): Payer: Self-pay | Admitting: Psychiatry

## 2023-11-02 ENCOUNTER — Ambulatory Visit (INDEPENDENT_AMBULATORY_CARE_PROVIDER_SITE_OTHER): Admitting: Psychiatry

## 2023-11-02 ENCOUNTER — Ambulatory Visit: Admitting: Professional

## 2023-11-02 VITALS — BP 140/95 | HR 92 | Ht 68.0 in | Wt 262.0 lb

## 2023-11-02 DIAGNOSIS — F429 Obsessive-compulsive disorder, unspecified: Secondary | ICD-10-CM | POA: Diagnosis not present

## 2023-11-02 DIAGNOSIS — F419 Anxiety disorder, unspecified: Secondary | ICD-10-CM | POA: Diagnosis not present

## 2023-11-02 DIAGNOSIS — F332 Major depressive disorder, recurrent severe without psychotic features: Secondary | ICD-10-CM | POA: Diagnosis not present

## 2023-11-02 DIAGNOSIS — F321 Major depressive disorder, single episode, moderate: Secondary | ICD-10-CM

## 2023-11-02 MED ORDER — BUSPIRONE HCL 7.5 MG PO TABS: 7.5000 mg | ORAL_TABLET | Freq: Every day | ORAL | 0 refills | Status: DC

## 2023-11-02 MED ORDER — BUPROPION HCL ER (XL) 150 MG PO TB24: 150.0000 mg | ORAL_TABLET | Freq: Every day | ORAL | 0 refills | Status: DC

## 2023-11-02 NOTE — Progress Notes (Signed)
 [facility] Follow up visit  Patient Identification: Jerry Cisneros MRN:  [id number] Date of Evaluation:  11/02/2023 Referral Source: primary care Chief Complaint:   No chief complaint on file. Depression  Visit Diagnosis:    ICD-10-CM   1. MDD (major depressive disorder), single episode, moderate (HCC)  F32.1     2. Obsessive-compulsive disorder, unspecified type  F42.9     3. Anxiety  F41.9       History of Present Illness: Patient is a 23 years old white male living with his parents and younger brother initially referred by primary care physician to establish care he is seeing therapist Nathanel Collet referred for assessment for of depression and anxiety and OCD.  Has been in community college but was stressed had to cut down a subject. Last visit  prozac  was increased Apparently still had depression and joined PHP and then IOP. Med adjusted to changing to wellbutrin  and not on prozac  now.   On eval doing fair in regard to depression but gets anxious, some obsessions and in therapy for ERP. Feels vistaril  helps prn but considering some regular meds  Has taken himself out of college and that has helped as it was difficult to cope  Aggravating factors:  difficult growing up  , religious and strict parents, obsessive thoughts of eating, Modifying factors: online chats ,online friends  Duration since age 49 Severity:manageable but stressed  Past Psychiatric History: depression, anxiety   Previous Psychotropic Medications:   Substance Abuse History in the last 12 months:  No.  Consequences of Substance Abuse: NA  Past Medical History: History reviewed. No pertinent past medical history.  Past Surgical History:  Procedure Laterality Date   NO PAST SURGERIES      Family Psychiatric History: dad questionable: mom possible depression, one uncle many hospital admissions. Diagnosis not known  Family History:  Family History  Problem Relation Age of Onset   Psoriasis Father      Social History:   Social History   Socioeconomic History   Marital status: Single    Spouse name: Not on file   Number of children: Not on file   Years of education: Not on file   Highest education level: Some college, no degree  Occupational History   Occupation: Consulting civil engineer  Tobacco Use   Smoking status: Never   Smokeless tobacco: Never  Substance and Sexual Activity   Alcohol use: Not on file   Drug use: Not on file   Sexual activity: Not on file  Other Topics Concern   Not on file  Social History Narrative   Mother is from [place] originally.    Social Drivers of Corporate investment banker Strain: Low Risk  (08/17/2023)   Received from [facility]   Overall Financial Resource Strain (CARDIA)    Difficulty of Paying Living Expenses: Not hard at all  Food Insecurity: No Food Insecurity (08/17/2023)   Received from [facility]   Hunger Vital Sign    Within the past 12 months, you worried that your food would run out before you got the money to buy more.: Never true    Within the past 12 months, the food you bought just didn't last and you didn't have money to get more.: Never true  Transportation Needs: No Transportation Needs (08/17/2023)   [facility] from [facility] - Transportation    Lack of Transportation (Medical): No    Lack of Transportation (Non-Medical): No  Physical Activity: Sufficiently Active (02/21/2023)  Exercise Vital Sign    Days of Exercise per Week: 5 days    Minutes of Exercise per Session: 150+ min  Stress: No Stress Concern Present (02/21/2023)   [employer] of Occupational Health - Occupational Stress Questionnaire    Feeling of Stress : Only a little  Social Connections: Moderately Isolated (02/21/2023)   Social Connection and Isolation Panel    Frequency of Communication with Friends and Family: Never    Frequency of Social Gatherings with Friends and Family: Once a week    Attends Religious Services: More than 4 times  per year    Active Member of [employer] or Organizations: Yes    Attends Engineer, structural: More than 4 times per year    Marital Status: Never married    Additional Social History: grew up with parents and young sibling brother. Strict controlling family, reglegious, had to go to church everything was sin if watch tv or earthly related. Difficult growing up  Allergies:   Allergies  Allergen Reactions   Erythromycin     Upset stomach    Metabolic Disorder Labs: No results found for: HGBA1C, MPG No results found for: PROLACTIN Lab Results  Component Value Date   CHOL 162 12/31/2021   TRIG 54 12/31/2021   HDL 51 12/31/2021   CHOLHDL 3.2 12/31/2021   LDLCALC 97 12/31/2021   LDLCALC 81 01/10/2019   Lab Results  Component Value Date   TSH 3.070 02/21/2023    Therapeutic Level Labs: No results found for: LITHIUM No results found for: CBMZ No results found for: VALPROATE  Current Medications: Current Outpatient Medications  Medication Sig Dispense Refill   busPIRone  (BUSPAR ) 7.5 MG tablet Take 1 tablet (7.5 mg total) by mouth daily. 30 tablet 0   hydrOXYzine  (ATARAX ) 10 MG tablet Take 1 tablet (10 mg total) by mouth 2 (two) times daily as needed. 60 tablet 1   buPROPion  (WELLBUTRIN  XL) 150 MG 24 hr tablet Take 1 tablet (150 mg total) by mouth daily. 30 tablet 0   No current facility-administered medications for this visit.     Psychiatric Specialty Exam: Review of Systems  Cardiovascular:  Negative for chest pain.  Neurological:  Negative for tremors.  Psychiatric/Behavioral:  Negative for agitation. The patient is nervous/anxious.     Blood pressure (!) 140/95, pulse 92, height 5' 8 (1.727 m), weight 262 lb (118.8 kg).Body mass index is 39.84 kg/m.  General Appearance: Casual  Eye Contact:  Fair  Speech:  Clear and Coherent  Volume:  Normal  Mood: fair  Affect:  Constricted  Thought Process:  Goal Directed  Orientation:  Full (Time, Place,  and Person)  Thought Content:  Obsessions and Rumination  Suicidal Thoughts:  No  Homicidal Thoughts:  No  Memory:  Immediate;   Fair  Judgement:  Fair  Insight:  Shallow  Psychomotor Activity:  Normal  Concentration:  Concentration: Fair  Recall:  [employer] of Knowledge:Good  Language: Fair  Akathisia:  No  Handed:    AIMS (if indicated):  not done  Assets:  Desire for Improvement Housing Physical Health  ADL's:  Intact  Cognition: WNL  Sleep:  Fair   Screenings: GAD-7    Garment/textile technologist Visit from 11/02/2023 in [facility] at [facility] Counselor from 11/15/2022 in [facility] Medicine at [employer] [facility] Counselor from 08/04/2022 in [facility] Medicine at [employer] Counselor from 02/10/2022 in [facility] Behavioral Medicine at  [facility] Office Visit from 05/18/2021 in [facility] at [facility]  Total GAD-7 Score 10 10 8 9 10    PHQ2-9    Flowsheet Row Office Visit from 11/02/2023 in [facility] at [facility] Counselor from 06/22/2023 in [facility] Behavior[facility] Medicine at [facility] Counselor from 02/16/2023 in [facility] Medicine at [facility] Counselor from 11[facility]23/2022 in [facility] Behavioral Medicine at [facility] Office Visit from 09/20/2022 in [facility] Health at [facility]  PHQ-2 Total Score 2 4 1 1 2   PHQ-9 Total Score 14 18 11  -- 16   SBQ-R    Flowsheet Row Counselor from 06/22/2023 in [facility] Behavior[facility] Medicine at [facility] Counselor from 08/04/2022 in [facility] Behavioral Medicine at [facility]  SBQ-R Total Score 10 8   Flowsheet Row Office Visit from 08/15/2023 in [facility] at [facility] Office Visit from 01/31/2023 in [facility] at [facility] Office Visit from 09/20/2022 in [facility] Health at [facility]  C-SSRS RISK CATEGORY No Risk No Risk Error: Q3, 4, or 5 should not be populated when Q2 is No    Assessment and Plan: as follows   Prior documentation reviewed   Major depressive disorder recurrent moderate to severe; manageable continue wellbutrin   Generalized anxiety disorder;has left college that stress is over, still gets anxious seldom takes vistaril  we will change to buspar  small dose and continue therapy  Also being evaluated for sleep apnea   OCD; baseline, continue therapy and will add buspar   Follow up with PCP in regard to any concern and monitor BP , denies chest pain, palpitations   Direct care time spent  20  minutes  with chart review, documentation elaboration and face-to-face Collaboration of Care: Primary Care Provider AEB notes and chart reviewed  Patient/Guardian was advised Release of Information must be obtained prior to any record release in order to collaborate their care with an outside provider. Patient/Guardian was advised if they have not already done so to contact the registration department to sign all necessary forms in order for us  to release information regarding their care.   Consent: Patient/Guardian gives verbal consent for treatment and assignment of benefits for services provided during this visit. Patient/Guardian expressed understanding and agreed to proceed.   Jackey Flight, MD 8/28/202510:04 AM

## 2023-11-09 ENCOUNTER — Ambulatory Visit (INDEPENDENT_AMBULATORY_CARE_PROVIDER_SITE_OTHER): Admitting: Professional

## 2023-11-09 ENCOUNTER — Encounter: Payer: Self-pay | Admitting: Professional

## 2023-11-09 DIAGNOSIS — F321 Major depressive disorder, single episode, moderate: Secondary | ICD-10-CM

## 2023-11-09 DIAGNOSIS — F419 Anxiety disorder, unspecified: Secondary | ICD-10-CM

## 2023-11-09 DIAGNOSIS — F429 Obsessive-compulsive disorder, unspecified: Secondary | ICD-10-CM

## 2023-11-09 NOTE — Progress Notes (Signed)
 [facility] Counselor/Therapist Progress Note  Patient ID: Jerry Cisneros, MRN: [id number],    Date: 11/09/2023  Time Spent: 55 minutes 1003-1058am  Subjective: The patient arrived on time for his in-person appointment.  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 Alleviate depressive symptoms and return to previous level of effective functioning. Objective Describe current and past experiences with depression including their impact on functioning and attempts to resolve it. Target Date: 2024-04-05 Frequency: biweekly  Progress: 100 Modality: individual  Related Interventions Encourage the client to share his/her thoughts and feelings of depression; express empathy and build rapport while identifying primary cognitive, behavioral, interpersonal, or other contributors to depression. Assess current and past mood episodes including their features, frequency, severity, and duration (e.g., clinical interview supplemented by the Inventory to Diagnose Depression). Objective Identify and replace thoughts and beliefs that support depression. Target Date: 2024-04-05 Frequency: biweekly  Progress: 70 Modality: individual  Related Interventions Conduct Cognitive-Behavioral  Therapy (see Cognitive Behavior Therapy by Almarie; Overcoming Depression by Marine dunker al.), beginning with helping the client learn the connection among cognition, depressive feelings, and actions. Assign the client to self-monitor thoughts, feelings, and actions in daily journal (e.g., Negative Thoughts Trigger Negative Feelings in the Adult Psychotherapy Homework Planner by Jenniffer; Daily Record of Dysfunctional Thoughts in Cognitive Therapy of Depression by Almarie Candida Gentry and Shona); process the journal material to challenge depressive thinking patterns and replace them with reality-based thoughts. Assign behavioral experiments in which depressive automatic thoughts are treated as hypotheses/prediction, reality-based alternative hypotheses/prediction are generated, and both are tested against the client's past, present, and/or future experiences. Facilitate and reinforce the client's shift from biased depressive self-talk and beliefs to reality-based cognitive messages that enhance self-confidence and increase adaptive actions (see Positive Self-Talk in the Adult Psychotherapy Homework Planner by Jenniffer). Objective Learn and implement behavioral strategies to overcome depression. Target Date: 2024-04-05 Frequency: biweekly  Progress: 40 Modality: individual  Related Interventions Engage the client in behavioral activation, increasing his/her activity level and contact with sources of reward, while identifying processes that inhibit activation (see Behavioral Activation for Depression by Loleta, Dimidjian, and Herman-Dunn; or assign Identify and Schedule Pleasant Activities in the Adult Psychotherapy Homework Planner by [facility]); use behavioral techniques such as instruction, rehearsal, role-playing, role reversal, as needed, to facilitate activity in the client's daily life; reinforce success. Assist the client in developing skills that increase the likelihood of deriving pleasure from  behavioral activation (e.g., assertiveness skills, developing an exercise plan, less internal/more external focus, increased social involvement); reinforce success. Objective Verbalize an understanding and resolution of current interpersonal problems. Target Date: 2024-04-05 Frequency: biweekly  Progress: 60 Modality: individual  Related Interventions For role transitions (e.g., beginning or ending a relationship or career, moving, promotion, retirement, graduation), help the client mourn the loss of the old role while recognizing positive and  negative aspects of the new role, and taking steps to gain mastery over the new role. Objective Learn and implement problem-solving and decision-making skills. Target Date: 2024-04-05 Frequency: biweekly  Progress: 40 Modality: individual  Objective Learn and implement conflict resolution skills to resolve interpersonal problems. Target Date: 2024-04-05 Frequency: biweekly  Progress: 50 Modality: individual  Related Interventions Teach conflict resolution skills (e.g., empathy, active listening, I messages, respectful communication, assertiveness without aggression, compromise); use psychoeducation, modeling, role-playing, and rehearsal to work through several current conflicts; assign homework exercises; review and repeat so as to integrate their use into the client's life. 2. Appropriately grieve the loss in order to normalize mood and to return to previously adaptive level of functioning. 3. Decrease the level of present conflict with parents while beginning to let go of or resolving past conflicts with them. Objective Describe the conflicts and the causes of conflicts between self and parents. Target Date: 2024-04-05 Frequency: biweekly  Progress: 80 Modality: individual  Related Interventions Give verbal permission for the client to have and express own feelings, thoughts, and perspectives in order to foster a sense of autonomy from family. Explore  the nature of the client's family conflicts and their perceived causes. Objective Identify own as well as others' role in the family conflicts. Target Date: 2024-04-05 Frequency: biweekly  Progress: 40 Modality: individual  Related Interventions Confront the client when he/she is not taking responsibility for his/her role in the family conflict and reinforce the client for owning responsibility for his/her contribution to the conflict. Objective Increase the level of independent functioning. Target Date: 2024-04-05 Frequency: biweekly  Progress: 50 Modality: individual  Related Interventions Confront the client's emotional dependence and avoidance of economic responsibility that promotes continuing pattern of living with parents; develop a plan for the client's healthy and responsible emancipation from parents that is, if possible, complete with their blessing (e.g., finding and keeping a job, saving money, socializing with friends, finding own housing, etc.). Objective Report an increase in resolving conflicts with parents by talking calmly and assertively rather than aggressively and defensively. Target Date: 2024-04-05 Frequency: biweekly  Progress: 30 Modality: individual  Related Interventions Use role-playing, role reversal, modeling, and behavioral rehearsal to help the client develop assertive ways to resolve conflict with parents (recommend Your Perfect Right: Assertiveness and Equality in Your Life and Relationships by Darrelyn armin Sick). 4. Develop coping strategies (e.g., feeling identification, problem-solving, assertiveness) to address emotional issues that could lead to relapse of the eating disorder. 5. Develop healthy cognitive patterns and beliefs about self that lead to positive identity and prevent a relapse of the eating disorder. 6. Develop healthy interpersonal relationships that lead to alleviation and help prevent the relapse of the eating disorder. 7. Develop healthy  interpersonal relationships that lead to the alleviation and help prevent the relapse of depression. 8. Develop healthy thinking patterns and beliefs about self, others, and the world that lead to the alleviation and help prevent the relapse of depression. 9. Enhance ability to effectively cope with the full variety of life's worries and anxieties. 10. Learn and implement coping skills that result in a reduction of anxiety and worry, and improved daily functioning. 11. Reach a level of reduced tension, increased satisfaction, and improved communication with family and/or other authority figures. 12. Recognize, accept, and cope with feelings of depression. 13. Restore normal eating patterns, healthy weight maintenance, and a realistic appraisal of body size. Objective Honestly describe the pattern of eating including types, amounts, and frequency of food consumed or hoarded.  Target Date: 2024-04-05 Frequency: biweekly  Progress: 70 Modality: individual  Related Interventions Assess the historical course of the disorder including the amount, type, and pattern of the client's food intake (e.g., too little food, too much food, binge eating, or hoarding food); perceived personal and interpersonal triggers and personal goals. Objective Establish regular eating patterns by eating at regular intervals and consuming optimal daily calories. Target Date: 2024-04-05 Frequency: biweekly  Progress: 50 Modality: individual  Related Interventions Establish healthy weight goals for the client per the Body Mass Index (BMI), the [place] Height and Weight Tables, or some other recognized standard. Objective Learn and implement skills for managing urges to engage in unhealthy eating or weight loss practices. Target Date: 2024-04-05 Frequency: biweekly  Progress: 50 Modality: individual  Related Interventions Teach the client tailored skills to manage high-risk situations including distraction, positive  self-talk, problem-solving, conflict resolution (e.g., empathy, active listening, I messages, respectful communication, assertiveness without aggression, compromise), or other social/ communication skills; use modeling, role-playing, and behavior rehearsal to work through several current situations. Objective State a basis for positive identity that is not based on weight and appearance but on character, traits, relationships, and intrinsic value. Target Date: 2024-04-05 Frequency: biweekly  Progress: 60 Modality: individual  Related Interventions Assist the client in identifying a basis for self-worth apart from body image by reviewing his/her talents, successes, positive traits, importance to others, and intrinsic spiritual value. Objective Verbalize an understanding of relapse prevention and the distinction between a lapse and a relapse. Target Date: 2024-04-05 Frequency: biweekly  Progress: 30 Modality: individual  Related Interventions Discuss with the client the distinction between a lapse and relapse, associating a lapse with an initial and reversible return of distress, urges, or to avoid, and relapse with the decision to return to the cycle of maladaptive thoughts and actions (e.g., feeling anxious, binging, then purging). Identify with the client future situations or circumstances in which lapses could occur. 14. Stabilize anxiety level while increasing ability to function on a daily basis. Objective Learn and implement problem-solving strategies for realistically addressing worries. Target Date: 2024-04-05 Frequency: biweekly  Progress: 30 Modality: individual  Related Interventions Teach the client problem-solving strategies involving specifically defining a problem, generating options for addressing it, evaluating the pros and cons of each option, selecting and implementing an optional action, and reevaluating and refining the action (or assign Applying Problem-Solving to  Interpersonal Conflict in the Adult Psychotherapy Homework Planner by Jenniffer). Objective Identify, challenge, and replace biased, fearful self-talk with positive, realistic, and empowering self-talk. Target Date: 2024-04-05 Frequency: biweekly  Progress: 50 Modality: individual  Related Interventions Explore the client's schema and self-talk that mediate his/her fear response; assist him/her in challenging the biases; replace the distorted messages with reality-based alternatives and positive, realistic self-talk that will increase his/her self-confidence in coping with irrational fears (see Cognitive Therapy of Anxiety Disorders by Gretta armin Mon). Assign the client a homework exercise in which he/she identifies fearful self-talk, identifies biases in the self-talk, generates alternatives, and tests through behavioral experiments (or assign Negative Thoughts Trigger Negative Feelings in the Adult Psychotherapy Homework Planner by [facility]); review and reinforce success, providing corrective feedback toward improvement. Objective Learn to accept limitations in life and commit to tolerating, rather than avoiding, unpleasant emotions while accomplishing meaningful goals. Target Date: 2024-04-05 Frequency: biweekly  Progress: 40 Modality: individual  Related Interventions Use techniques from Acceptance and Commitment Therapy to help client accept uncomfortable realities such as lack of complete control, imperfections, and uncertainty and tolerate unpleasant emotions  and thoughts in order to accomplish value-consistent goals. Objective Learn and implement personal and interpersonal skills to reduce anxiety and improve interpersonal relationships. Target Date: 2024-04-05 Frequency: biweekly  Progress: 60 Modality: individual  Related Interventions Use instruction, modeling, and role-playing to build the client's general social, communication, and/or conflict resolution skills. Objective Maintain  involvement in work, family, and social activities. Target Date: 2024-04-05 Frequency: biweekly  Progress: 60 Modality: individual  Related Interventions Support the client in following through with work, family, and social activities rather than escaping or avoiding them to focus on anxiety. 15. Terminate overeating and implement lifestyle changes that lead to weight loss and improved health. 16. Terminate the pattern of binge eating and purging behavior with a return to eating normal amounts of nutritious foods.  Diagnosis:MDD (major depressive disorder), single episode, moderate (HCC)  Obsessive-compulsive disorder, unspecified type  Anxiety  Plan:  -decide if you want to have full eval with psychologist or complete on [facility] -meet again on Thursday, November 02, 2023 at 4pm.

## 2023-11-23 ENCOUNTER — Ambulatory Visit: Admitting: Professional

## 2023-11-26 ENCOUNTER — Other Ambulatory Visit (HOSPITAL_COMMUNITY): Payer: Self-pay | Admitting: Psychiatry

## 2023-12-14 ENCOUNTER — Ambulatory Visit (INDEPENDENT_AMBULATORY_CARE_PROVIDER_SITE_OTHER): Admitting: Professional

## 2023-12-14 ENCOUNTER — Encounter: Payer: Self-pay | Admitting: Professional

## 2023-12-14 DIAGNOSIS — F429 Obsessive-compulsive disorder, unspecified: Secondary | ICD-10-CM

## 2023-12-14 DIAGNOSIS — F419 Anxiety disorder, unspecified: Secondary | ICD-10-CM

## 2023-12-14 DIAGNOSIS — F321 Major depressive disorder, single episode, moderate: Secondary | ICD-10-CM

## 2023-12-14 NOTE — Progress Notes (Signed)
 [facility] Counselor/Therapist Progress Note  Patient ID: Jerry Cisneros, MRN: [id number],    Date: 12/14/2023  Time Spent: 65 minutes 1103-1208pm  Subjective: The patient arrived on time for his in-person appointment.  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 Alleviate depressive symptoms and return to previous level of effective functioning. Objective Describe current and past experiences with depression including their impact on functioning and attempts to resolve it. Target Date: 2024-04-05 Frequency: biweekly  Progress: 100 Modality: individual  Related Interventions Encourage the client to share his/her thoughts and feelings of depression; express empathy and build rapport while identifying primary cognitive, behavioral, interpersonal, or other contributors to depression. Assess current and past mood episodes including their features, frequency, severity, and duration (e.g., clinical interview supplemented by the Inventory to Diagnose Depression). Objective Identify and replace thoughts and beliefs that support depression. Target Date: 2024-04-05  Frequency: biweekly  Progress: 70 Modality: individual  Related Interventions Conduct Cognitive-Behavioral Therapy (see Cognitive Behavior Therapy by Almarie; Overcoming Depression by Marine dunker al.), beginning with helping the client learn the connection among cognition, depressive feelings, and actions. Assign the client to self-monitor thoughts, feelings, and actions in daily journal (e.g., Negative Thoughts Trigger Negative Feelings in the Adult Psychotherapy Homework Planner by Jenniffer; Daily Record of Dysfunctional Thoughts in Cognitive Therapy of Depression by Almarie Candida Gentry and Shona); process the journal material to challenge depressive thinking patterns and replace them with reality-based thoughts. Assign behavioral experiments in which depressive automatic thoughts are treated as hypotheses/prediction, reality-based alternative hypotheses/prediction are generated, and both are tested against the client's past, present, and/or future experiences. Facilitate and reinforce the client's shift from biased depressive self-talk and beliefs to reality-based cognitive messages that enhance self-confidence and increase adaptive actions (see Positive Self-Talk in the Adult Psychotherapy Homework Planner by Jenniffer). Objective Learn and implement behavioral strategies to overcome depression. Target Date: 2024-04-05 Frequency: biweekly  Progress: 40 Modality: individual  Related Interventions Engage the client in behavioral activation, increasing his/her activity level and contact with sources of reward, while identifying processes that inhibit activation (see Behavioral Activation for Depression by Loleta, Dimidjian, and Herman-Dunn; or assign Identify and Schedule Pleasant Activities in the Adult Psychotherapy Homework Planner by [facility]); use behavioral techniques such as instruction, rehearsal, role-playing, role reversal, as needed, to facilitate activity in the client's daily life;  reinforce success. Assist the client in developing skills that increase the likelihood of deriving pleasure from behavioral activation (e.g., assertiveness skills, developing an exercise plan, less internal/more external focus, increased social involvement); reinforce success. Objective Verbalize an understanding and resolution of current interpersonal problems. Target Date: 2024-04-05 Frequency: biweekly  Progress: 60 Modality: individual  Related Interventions For role transitions (e.g., beginning or ending a relationship or career, moving, promotion, retirement, graduation), help the client mourn the loss  of the old role while recognizing positive and negative aspects of the new role, and taking steps to gain mastery over the new role. Objective Learn and implement problem-solving and decision-making skills. Target Date: 2024-04-05 Frequency: biweekly  Progress: 40 Modality: individual  Objective Learn and implement conflict resolution skills to resolve interpersonal problems. Target Date: 2024-04-05 Frequency: biweekly  Progress: 50 Modality: individual  Related Interventions Teach conflict resolution skills (e.g., empathy, active listening, I messages, respectful communication, assertiveness without aggression, compromise); use psychoeducation, modeling, role-playing, and rehearsal to work through several current conflicts; assign homework exercises; review and repeat so as to integrate their use into the client's life. 2. Appropriately grieve the loss in order to normalize mood and to return to previously adaptive level of functioning. 3. Decrease the level of present conflict with parents while beginning to let go of or resolving past conflicts with them. Objective Describe the conflicts and the causes of conflicts between self and parents. Target Date: 2024-04-05 Frequency: biweekly  Progress: 80 Modality: individual  Related Interventions Give verbal permission for the client to have  and express own feelings, thoughts, and perspectives in order to foster a sense of autonomy from family. Explore the nature of the client's family conflicts and their perceived causes. Objective Identify own as well as others' role in the family conflicts. Target Date: 2024-04-05 Frequency: biweekly  Progress: 40 Modality: individual  Related Interventions Confront the client when he/she is not taking responsibility for his/her role in the family conflict and reinforce the client for owning responsibility for his/her contribution to the conflict. Objective Increase the level of independent functioning. Target Date: 2024-04-05 Frequency: biweekly  Progress: 50 Modality: individual  Related Interventions Confront the client's emotional dependence and avoidance of economic responsibility that promotes continuing pattern of living with parents; develop a plan for the client's healthy and responsible emancipation from parents that is, if possible, complete with their blessing (e.g., finding and keeping a job, saving money, socializing with friends, finding own housing, etc.). Objective Report an increase in resolving conflicts with parents by talking calmly and assertively rather than aggressively and defensively. Target Date: 2024-04-05 Frequency: biweekly  Progress: 30 Modality: individual  Related Interventions Use role-playing, role reversal, modeling, and behavioral rehearsal to help the client develop assertive ways to resolve conflict with parents (recommend Your Perfect Right: Assertiveness and Equality in Your Life and Relationships by Darrelyn armin Sick). 4. Develop coping strategies (e.g., feeling identification, problem-solving, assertiveness) to address emotional issues that could lead to relapse of the eating disorder. 5. Develop healthy cognitive patterns and beliefs about self that lead to positive identity and prevent a relapse of the eating disorder. 6. Develop healthy interpersonal  relationships that lead to alleviation and help prevent the relapse of the eating disorder. 7. Develop healthy interpersonal relationships that lead to the alleviation and help prevent the relapse of depression. 8. Develop healthy thinking patterns and beliefs about self, others, and the world that lead to the alleviation and help prevent the relapse of depression. 9. Enhance ability to effectively cope with the full variety of life's worries and anxieties. 10. Learn and implement coping skills that result in a reduction of anxiety and worry, and improved daily functioning. 11. Reach a level of reduced tension, increased satisfaction, and improved communication with family and/or other authority figures. 12. Recognize, accept, and cope with feelings of depression. 13. Restore normal eating patterns, healthy weight maintenance, and a realistic appraisal of body size. Objective Honestly describe the pattern of eating including types,  amounts, and frequency of food consumed or hoarded. Target Date: 2024-04-05 Frequency: biweekly  Progress: 70 Modality: individual  Related Interventions Assess the historical course of the disorder including the amount, type, and pattern of the client's food intake (e.g., too little food, too much food, binge eating, or hoarding food); perceived personal and interpersonal triggers and personal goals. Objective Establish regular eating patterns by eating at regular intervals and consuming optimal daily calories. Target Date: 2024-04-05 Frequency: biweekly  Progress: 50 Modality: individual  Related Interventions Establish healthy weight goals for the client per the Body Mass Index (BMI), the [place] Height and Weight Tables, or some other recognized standard. Objective Learn and implement skills for managing urges to engage in unhealthy eating or weight loss practices. Target Date: 2024-04-05 Frequency: biweekly  Progress: 50 Modality: individual  Related  Interventions Teach the client tailored skills to manage high-risk situations including distraction, positive self-talk, problem-solving, conflict resolution (e.g., empathy, active listening, I messages, respectful communication, assertiveness without aggression, compromise), or other social/ communication skills; use modeling, role-playing, and behavior rehearsal to work through several current situations. Objective State a basis for positive identity that is not based on weight and appearance but on character, traits, relationships, and intrinsic value. Target Date: 2024-04-05 Frequency: biweekly  Progress: 60 Modality: individual  Related Interventions Assist the client in identifying a basis for self-worth apart from body image by reviewing his/her talents, successes, positive traits, importance to others, and intrinsic spiritual value. Objective Verbalize an understanding of relapse prevention and the distinction between a lapse and a relapse. Target Date: 2024-04-05 Frequency: biweekly  Progress: 30 Modality: individual  Related Interventions Discuss with the client the distinction between a lapse and relapse, associating a lapse with an initial and reversible return of distress, urges, or to avoid, and relapse with the decision to return to the cycle of maladaptive thoughts and actions (e.g., feeling anxious, binging, then purging). Identify with the client future situations or circumstances in which lapses could occur. 14. Stabilize anxiety level while increasing ability to function on a daily basis. Objective Learn and implement problem-solving strategies for realistically addressing worries. Target Date: 2024-04-05 Frequency: biweekly  Progress: 30 Modality: individual  Related Interventions Teach the client problem-solving strategies involving specifically defining a problem, generating options for addressing it, evaluating the pros and cons of each option, selecting and implementing  an optional action, and reevaluating and refining the action (or assign Applying Problem-Solving to Interpersonal Conflict in the Adult Psychotherapy Homework Planner by Jenniffer). Objective Identify, challenge, and replace biased, fearful self-talk with positive, realistic, and empowering self-talk. Target Date: 2024-04-05 Frequency: biweekly  Progress: 50 Modality: individual  Related Interventions Explore the client's schema and self-talk that mediate his/her fear response; assist him/her in challenging the biases; replace the distorted messages with reality-based alternatives and positive, realistic self-talk that will increase his/her self-confidence in coping with irrational fears (see Cognitive Therapy of Anxiety Disorders by Gretta armin Mon). Assign the client a homework exercise in which he/she identifies fearful self-talk, identifies biases in the self-talk, generates alternatives, and tests through behavioral experiments (or assign Negative Thoughts Trigger Negative Feelings in the Adult Psychotherapy Homework Planner by [facility]); review and reinforce success, providing corrective feedback toward improvement. Objective Learn to accept limitations in life and commit to tolerating, rather than avoiding, unpleasant emotions while accomplishing meaningful goals. Target Date: 2024-04-05 Frequency: biweekly  Progress: 40 Modality: individual  Related Interventions Use techniques from Acceptance and Commitment Therapy to help client accept uncomfortable realities such as lack of complete  control, imperfections, and uncertainty and tolerate unpleasant emotions and thoughts in order to accomplish value-consistent goals. Objective Learn and implement personal and interpersonal skills to reduce anxiety and improve interpersonal relationships. Target Date: 2024-04-05 Frequency: biweekly  Progress: 60 Modality: individual  Related Interventions Use instruction, modeling, and role-playing to build the  client's general social, communication, and/or conflict resolution skills. Objective Maintain involvement in work, family, and social activities. Target Date: 2024-04-05 Frequency: biweekly  Progress: 60 Modality: individual  Related Interventions Support the client in following through with work, family, and social activities rather than escaping or avoiding them to focus on anxiety. 15. Terminate overeating and implement lifestyle changes that lead to weight loss and improved health. 16. Terminate the pattern of binge eating and purging behavior with a return to eating normal amounts of nutritious foods.  Diagnosis:MDD (major depressive disorder), single episode, moderate (HCC)  Obsessive-compulsive disorder, unspecified type  Anxiety  Rule In/Out ADHD  Plan:  -meet again on Thursday, February 04, 2024 4pm in person.

## 2023-12-28 ENCOUNTER — Ambulatory Visit: Admitting: Professional

## 2023-12-29 ENCOUNTER — Other Ambulatory Visit (HOSPITAL_COMMUNITY): Payer: Self-pay | Admitting: Psychiatry

## 2024-01-01 ENCOUNTER — Ambulatory Visit: Admitting: Professional

## 2024-01-02 ENCOUNTER — Ambulatory Visit (HOSPITAL_COMMUNITY): Admitting: Psychiatry

## 2024-01-04 ENCOUNTER — Encounter: Payer: Self-pay | Admitting: Professional

## 2024-01-04 ENCOUNTER — Ambulatory Visit: Admitting: Professional

## 2024-01-04 DIAGNOSIS — F321 Major depressive disorder, single episode, moderate: Secondary | ICD-10-CM | POA: Diagnosis not present

## 2024-01-04 DIAGNOSIS — F419 Anxiety disorder, unspecified: Secondary | ICD-10-CM

## 2024-01-04 DIAGNOSIS — F429 Obsessive-compulsive disorder, unspecified: Secondary | ICD-10-CM

## 2024-01-04 NOTE — Progress Notes (Signed)
 [facility] Counselor/Therapist Progress Note  Patient ID: Jerry Cisneros, MRN: [id number],    Date: 01/04/2024  Time Spent: 54 minutes [id number]  Subjective: The patient arrived on time for his in-person appointment.  Issues addressed: 1-gender differences -shared that he is exploring a transgender lifestyle -he is growing his hair out -discussed therapy options and suggested he would want a specialist as I have no experience -provided pt options to explore 2-parents a- mother has remarked on his long hair -she has commented that if there is some gender confusion it might have happened in his younger year b-father -still having pt do his lesson planning and teaching him math so he can do his job c-neither parent would be supportive of his choice to be in his choice to identify as trans, to grow his hair out, and to start going by they vs he 2-career a-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 -he is enjoying his job and has feels confident in his work -he has started to interact more with coworkers and enjoys b-he is considering applying to be a lawyer -his father commented that he will need to cut his hair if he expects anyone to hire him -pt thinks he would enjoy -discussed school and considering one class at a time with work and not trying to do FT school 3-social -he is feeling confident socially -continues to be involved in [employer] -looking at getting involved in church and community choirs  Treatment Plan Problems Addressed  Anxiety, Eating Disorders And Obesity, Family Conflict, Unipolar Depression Goals 1. Alleviate depressive symptoms and return to previous level of effective functioning. Objective Describe current and past experiences with depression including their impact on functioning and attempts to resolve it. Target Date: 2024-04-05 Frequency: biweekly  Progress: 100 Modality: individual   Related Interventions Encourage the client to share his/her thoughts and feelings of depression; express empathy and build rapport while identifying primary cognitive, behavioral, interpersonal, or other contributors to depression. Assess current and past mood episodes including their features, frequency, severity, and duration (e.g., clinical interview supplemented by the Inventory to Diagnose Depression). Objective Identify and replace thoughts and beliefs that support depression. Target Date: 2024-04-05 Frequency: biweekly  Progress: 70 Modality: individual  Related Interventions Conduct Cognitive-Behavioral Therapy (see Cognitive Behavior Therapy by Almarie; Overcoming Depression by Marine dunker al.), beginning with helping the client learn the connection among cognition, depressive feelings, and actions. Assign the client to self-monitor thoughts, feelings, and actions in daily journal (e.g., Negative Thoughts Trigger Negative Feelings in the Adult Psychotherapy Homework Planner by Jenniffer; Daily Record of Dysfunctional Thoughts in Cognitive Therapy of Depression by Almarie Candida Gentry and Shona); process the journal material to challenge depressive thinking patterns and replace them with reality-based thoughts. Assign behavioral experiments in which depressive automatic thoughts are treated as hypotheses/prediction, reality-based alternative hypotheses/prediction are generated, and both are tested against the client's past, present, and/or future experiences. Facilitate and reinforce the client's shift from biased depressive self-talk and beliefs to reality-based cognitive messages that enhance self-confidence and increase adaptive actions (see Positive Self-Talk in the Adult Psychotherapy Homework Planner by Jenniffer). Objective Learn and implement behavioral strategies to overcome depression. Target Date: 2024-04-05 Frequency: biweekly  Progress: 40 Modality: individual  Related  Interventions Engage the client in behavioral activation, increasing his/her activity level and contact with sources of reward, while identifying processes that inhibit activation (see Behavioral Activation for Depression by Loleta, Dimidjian, and Herman-Dunn; or assign Identify and Schedule  Pleasant Activities in the Adult Psychotherapy Homework Planner by [facility]); use behavioral techniques such as instruction, rehearsal, role-playing, role reversal, as needed, to facilitate activity in the client's daily life; reinforce success. Assist the client in developing skills that increase the likelihood of deriving pleasure from behavioral activation (e.g., assertiveness skills, developing an exercise plan, less internal/more external focus, increased social involvement); reinforce success. Objective Verbalize an understanding and resolution of current interpersonal problems. Target Date: 2024-04-05 Frequency: biweekly  Progress: 60 Modality: individual  Related Interventions For role transitions (e.g., beginning or ending a relationship or career, moving, promotion, retirement, graduation), help the client mourn the loss of the old role while recognizing positive and negative aspects of the new role, and taking steps to gain mastery over the new role. Objective Learn and implement problem-solving and decision-making skills. Target Date: 2024-04-05 Frequency: biweekly  Progress: 40 Modality: individual  Objective Learn and implement conflict resolution skills to resolve interpersonal problems. Target Date: 2024-04-05 Frequency: biweekly  Progress: 50 Modality: individual  Related Interventions Teach conflict resolution skills (e.g., empathy, active listening, I messages, respectful communication, assertiveness without aggression, compromise); use psychoeducation, modeling, role-playing, and rehearsal to work through several current conflicts; assign homework exercises; review and repeat so as to  integrate their use into the client's life. 2. Appropriately grieve the loss in order to normalize mood and to return to previously adaptive level of functioning. 3. Decrease the level of present conflict with parents while beginning to let go of or resolving past conflicts with them. Objective Describe the conflicts and the causes of conflicts between self and parents. Target Date: 2024-04-05 Frequency: biweekly  Progress: 80 Modality: individual  Related Interventions Give verbal permission for the client to have and express own feelings, thoughts, and perspectives in order to foster a sense of autonomy from family. Explore the nature of the client's family conflicts and their perceived causes. Objective Identify own as well as others' role in the family conflicts. Target Date: 2024-04-05 Frequency: biweekly  Progress: 40 Modality: individual  Related Interventions Confront the client when he/she is not taking responsibility for his/her role in the family conflict and reinforce the client for owning responsibility for his/her contribution to the conflict. Objective Increase the level of independent functioning. Target Date: 2024-04-05 Frequency: biweekly  Progress: 50 Modality: individual  Related Interventions Confront the client's emotional dependence and avoidance of economic responsibility that promotes continuing pattern of living with parents; develop a plan for the client's healthy and responsible emancipation from parents that is, if possible, complete with their blessing (e.g., finding and keeping a job, saving money, socializing with friends, finding own housing, etc.). Objective Report an increase in resolving conflicts with parents by talking calmly and assertively rather than aggressively and defensively. Target Date: 2024-04-05 Frequency: biweekly  Progress: 30 Modality: individual  Related Interventions Use role-playing, role reversal, modeling, and behavioral rehearsal to  help the client develop assertive ways to resolve conflict with parents (recommend Your Perfect Right: Assertiveness and Equality in Your Life and Relationships by Darrelyn armin Sick). 4. Develop coping strategies (e.g., feeling identification, problem-solving, assertiveness) to address emotional issues that could lead to relapse of the eating disorder. 5. Develop healthy cognitive patterns and beliefs about self that lead to positive identity and prevent a relapse of the eating disorder. 6. Develop healthy interpersonal relationships that lead to alleviation and help prevent the relapse of the eating disorder. 7. Develop healthy interpersonal relationships that lead to the alleviation and help prevent the relapse of depression. 8. Develop  healthy thinking patterns and beliefs about self, others, and the world that lead to the alleviation and help prevent the relapse of depression. 9. Enhance ability to effectively cope with the full variety of life's worries and anxieties. 10. Learn and implement coping skills that result in a reduction of anxiety and worry, and improved daily functioning. 11. Reach a level of reduced tension, increased satisfaction, and improved communication with family and/or other authority figures. 12. Recognize, accept, and cope with feelings of depression. 13. Restore normal eating patterns, healthy weight maintenance, and a realistic appraisal of body size. Objective Honestly describe the pattern of eating including types, amounts, and frequency of food consumed or hoarded. Target Date: 2024-04-05 Frequency: biweekly  Progress: 70 Modality: individual  Related Interventions Assess the historical course of the disorder including the amount, type, and pattern of the client's food intake (e.g., too little food, too much food, binge eating, or hoarding food); perceived personal and interpersonal triggers and personal goals. Objective Establish regular eating patterns by eating  at regular intervals and consuming optimal daily calories. Target Date: 2024-04-05 Frequency: biweekly  Progress: 50 Modality: individual  Related Interventions Establish healthy weight goals for the client per the Body Mass Index (BMI), the [place] Height and Weight Tables, or some other recognized standard. Objective Learn and implement skills for managing urges to engage in unhealthy eating or weight loss practices. Target Date: 2024-04-05 Frequency: biweekly  Progress: 50 Modality: individual  Related Interventions Teach the client tailored skills to manage high-risk situations including distraction, positive self-talk, problem-solving, conflict resolution (e.g., empathy, active listening, I messages, respectful communication, assertiveness without aggression, compromise), or other social/ communication skills; use modeling, role-playing, and behavior rehearsal to work through several current situations. Objective State a basis for positive identity that is not based on weight and appearance but on character, traits, relationships, and intrinsic value. Target Date: 2024-04-05 Frequency: biweekly  Progress: 60 Modality: individual  Related Interventions Assist the client in identifying a basis for self-worth apart from body image by reviewing his/her talents, successes, positive traits, importance to others, and intrinsic spiritual value. Objective Verbalize an understanding of relapse prevention and the distinction between a lapse and a relapse. Target Date: 2024-04-05 Frequency: biweekly  Progress: 30 Modality: individual  Related Interventions Discuss with the client the distinction between a lapse and relapse, associating a lapse with an initial and reversible return of distress, urges, or to avoid, and relapse with the decision to return to the cycle of maladaptive thoughts and actions (e.g., feeling anxious, binging, then purging). Identify with the client future situations or  circumstances in which lapses could occur. 14. Stabilize anxiety level while increasing ability to function on a daily basis. Objective Learn and implement problem-solving strategies for realistically addressing worries. Target Date: 2024-04-05 Frequency: biweekly  Progress: 30 Modality: individual  Related Interventions Teach the client problem-solving strategies involving specifically defining a problem, generating options for addressing it, evaluating the pros and cons of each option, selecting and implementing an optional action, and reevaluating and refining the action (or assign Applying Problem-Solving to Interpersonal Conflict in the Adult Psychotherapy Homework Planner by Jenniffer). Objective Identify, challenge, and replace biased, fearful self-talk with positive, realistic, and empowering self-talk. Target Date: 2024-04-05 Frequency: biweekly  Progress: 50 Modality: individual  Related Interventions Explore the client's schema and self-talk that mediate his/her fear response; assist him/her in challenging the biases; replace the distorted messages with reality-based alternatives and positive, realistic self-talk that will increase his/her self-confidence in coping with irrational fears (see  Cognitive Therapy of Anxiety Disorders by Gretta armin Mon). Assign the client a homework exercise in which he/she identifies fearful self-talk, identifies biases in the self-talk, generates alternatives, and tests through behavioral experiments (or assign Negative Thoughts Trigger Negative Feelings in the Adult Psychotherapy Homework Planner by [facility]); review and reinforce success, providing corrective feedback toward improvement. Objective Learn to accept limitations in life and commit to tolerating, rather than avoiding, unpleasant emotions while accomplishing meaningful goals. Target Date: 2024-04-05 Frequency: biweekly  Progress: 40 Modality: individual  Related Interventions Use techniques  from Acceptance and Commitment Therapy to help client accept uncomfortable realities such as lack of complete control, imperfections, and uncertainty and tolerate unpleasant emotions and thoughts in order to accomplish value-consistent goals. Objective Learn and implement personal and interpersonal skills to reduce anxiety and improve interpersonal relationships. Target Date: 2024-04-05 Frequency: biweekly  Progress: 60 Modality: individual  Related Interventions Use instruction, modeling, and role-playing to build the client's general social, communication, and/or conflict resolution skills. Objective Maintain involvement in work, family, and social activities. Target Date: 2024-04-05 Frequency: biweekly  Progress: 60 Modality: individual  Related Interventions Support the client in following through with work, family, and social activities rather than escaping or avoiding them to focus on anxiety. 15. Terminate overeating and implement lifestyle changes that lead to weight loss and improved health. 16. Terminate the pattern of binge eating and purging behavior with a return to eating normal amounts of nutritious foods.  Diagnosis:MDD (major depressive disorder), single episode, moderate (HCC)  Obsessive-compulsive disorder, unspecified type  Anxiety  Rule In/Out ADHD  Plan:  -keep testing appt with Dr. Frederic Fire on January 17, 2024 -review email with gender affirming therapists and schedule appt -meet again on Thursday, January 25, 2024 at 3pm in person.

## 2024-01-11 ENCOUNTER — Ambulatory Visit: Admitting: Professional

## 2024-01-16 ENCOUNTER — Encounter (HOSPITAL_COMMUNITY): Payer: Self-pay | Admitting: Psychiatry

## 2024-01-16 ENCOUNTER — Ambulatory Visit (INDEPENDENT_AMBULATORY_CARE_PROVIDER_SITE_OTHER): Admitting: Psychiatry

## 2024-01-16 VITALS — BP 134/93 | HR 101 | Ht 68.0 in | Wt 256.4 lb

## 2024-01-16 DIAGNOSIS — F429 Obsessive-compulsive disorder, unspecified: Secondary | ICD-10-CM | POA: Diagnosis not present

## 2024-01-16 DIAGNOSIS — F321 Major depressive disorder, single episode, moderate: Secondary | ICD-10-CM

## 2024-01-16 DIAGNOSIS — R5383 Other fatigue: Secondary | ICD-10-CM | POA: Diagnosis not present

## 2024-01-16 DIAGNOSIS — F411 Generalized anxiety disorder: Secondary | ICD-10-CM

## 2024-01-16 DIAGNOSIS — F419 Anxiety disorder, unspecified: Secondary | ICD-10-CM

## 2024-01-16 MED ORDER — BUPROPION HCL ER (XL) 150 MG PO TB24: 150.0000 mg | ORAL_TABLET | Freq: Every day | ORAL | 1 refills | Status: DC

## 2024-01-16 NOTE — Progress Notes (Signed)
 [facility] Follow up visit  Patient Identification: Jerry Cisneros MRN:  [id number] Date of Evaluation:  01/16/2024 Referral Source: primary care Chief Complaint:   No chief complaint on file. Depression  Visit Diagnosis:    ICD-10-CM   1. MDD (major depressive disorder), single episode, moderate (HCC)  F32.1     2. Obsessive-compulsive disorder, unspecified type  F42.9     3. Anxiety  F41.9     4. Tiredness  R53.83       History of Present Illness: Patient is a 23 years old white male living with his parents and younger brother initially referred by primary care physician to establish care he is seeing therapist Nathanel Collet referred for assessment for of depression and anxiety and OCD.  On eval working at [employer] as [employer] Has been feeling better, says was having some bad thoughts related to job stress one day but not suicidal now.  Not taking buspar  feel it  may have reduced weight but is using CPAP and has lost weight maybe due to that  Still has gender dysphoria but connecting with groups and meetings. It Is helping  He may move to another therapist from [employer]  Says obsessions are in control and not worse and doesn't want to be on buspar  or more meds Feels wellbutrin  keeping a balance of mood and anxiety    Has taken himself out of college and that has helped as it was difficult to cope  Aggravating factors:  difficult growing up , religious and strict parents, obsessive thoughts of eating but better Modifying factors: online chats , online friends   Duration since age 63 Severity: manageable   Past Psychiatric History: depression, anxiety   Previous Psychotropic Medications:   Substance Abuse History in the last 12 months:  No.  Consequences of Substance Abuse: NA  Past Medical History: History reviewed. No pertinent past medical history.  Past Surgical History:  Procedure Laterality Date   NO PAST SURGERIES      Family Psychiatric History: dad  questionable: mom possible depression, one uncle many hospital admissions. Diagnosis not known  Family History:  Family History  Problem Relation Age of Onset   Psoriasis Father     Social History:   Social History   Socioeconomic History   Marital status: Single    Spouse name: Not on file   Number of children: Not on file   Years of education: Not on file   Highest education level: Some college, no degree  Occupational History   Occupation: Consulting Civil Engineer  Tobacco Use   Smoking status: Never   Smokeless tobacco: Never  Substance and Sexual Activity   Alcohol use: Not on file   Drug use: Not on file   Sexual activity: Not on file  Other Topics Concern   Not on file  Social History Narrative   Mother is from [place] originally.    Social Drivers of Health   Financial Resource Strain: Medium Risk (11/28/2023)   Received from [facility]   Overall Financial Resource Strain (CARDIA)    How hard is it for you to pay for the very basics like food, housing, medical care, and heating?: Somewhat hard  Food Insecurity: No Food Insecurity (11/28/2023)   Received from [facility]   Hunger Vital Sign    Within the past 12 months, you worried that your food would run out before you got the money to buy more.: Never true    Within the past 12 months, the food  you bought just didn't last and you didn't have money to get more.: Never true  Transportation Needs: No Transportation Needs (11/28/2023)   Received from [facility] - Transportation    In the past 12 months, has lack of transportation kept you from medical appointments or from getting medications?: No    In the past 12 months, has lack of transportation kept you from meetings, work, or from getting things needed for daily living?: No  Physical Activity: Sufficiently Active (11/28/2023)   Received from [facility]   Exercise Vital Sign    On average, how many days per week do you engage in moderate to strenuous  exercise (like a brisk walk)?: 6 days    On average, how many minutes do you engage in exercise at this level?: 150+ min  Stress: Stress Concern Present (11/28/2023)   Received from [facility] - Occupational Stress Questionnaire    Do you feel stress - tense, restless, nervous, or anxious, or unable to sleep at night because your mind is troubled all the time - these days?: Rather much  Social Connections: Somewhat Isolated (11/28/2023)   Received from [facility]   Social Network    How would you rate your social network (family, work, friends)?: Restricted participation with some degree of social isolation    Additional Social History: grew up with parents and young sibling brother. Strict controlling family, reglegious, had to go to church everything was sin if watch tv or earthly related. Difficult growing up  Allergies:   Allergies  Allergen Reactions   Erythromycin     Upset stomach    Metabolic Disorder Labs: No results found for: HGBA1C, MPG No results found for: PROLACTIN Lab Results  Component Value Date   CHOL 162 12/31/2021   TRIG 54 12/31/2021   HDL 51 12/31/2021   CHOLHDL 3.2 12/31/2021   LDLCALC 97 12/31/2021   LDLCALC 81 01/10/2019   Lab Results  Component Value Date   TSH 3.070 02/21/2023    Therapeutic Level Labs: No results found for: LITHIUM No results found for: CBMZ No results found for: VALPROATE  Current Medications: Current Outpatient Medications  Medication Sig Dispense Refill   buPROPion  (WELLBUTRIN  XL) 150 MG 24 hr tablet Take 1 tablet by mouth once daily 30 tablet 0   hydrOXYzine  (ATARAX ) 10 MG tablet Take 1 tablet (10 mg total) by mouth 2 (two) times daily as needed. 60 tablet 1   busPIRone  (BUSPAR ) 7.5 MG tablet Take 1 tablet by mouth once daily (Patient not taking: Reported on 01/16/2024) 30 tablet 0   No current facility-administered medications for this visit.      Psychiatric Specialty Exam: Review of Systems  Cardiovascular:  Negative for chest pain.  Neurological:  Negative for tremors.  Psychiatric/Behavioral:  Negative for agitation.     Blood pressure (!) 134/93, pulse (!) 101, height 5' 8 (1.727 m), weight 256 lb 6.4 oz (116.3 kg), SpO2 98%.Body mass index is 38.99 kg/m.  General Appearance: Casual  Eye Contact:  Fair  Speech:  Clear and Coherent  Volume:  Normal  Mood: fair  Affect:  Constricted  Thought Process:  Goal Directed  Orientation:  Full (Time, Place, and Person)  Thought Content:  Obsessions and Rumination  Suicidal Thoughts:  No  Homicidal Thoughts:  No  Memory:  Immediate;   Fair  Judgement:  Fair  Insight:  Shallow  Psychomotor Activity:  Normal  Concentration:  Concentration:  Fair  Recall:  Dotti Abe of Knowledge:Good  Language: Fair  Akathisia:  No  Handed:    AIMS (if indicated):  not done  Assets:  Desire for Improvement Housing Physical Health  ADL's:  Intact  Cognition: WNL  Sleep:  Fair   Screenings: GAD-7    Garment/textile Technologist Visit from 01/16/2024 in [facility] at [facility] Office Visit from 11/02/2023 in [facility] at [facility] Counselor from 11/15/2022 in [facility] Behavioral Medicine at [facility] Counselor from 08/04/2022 in [facility] Behavioral Medicine at [facility] Counselor from 02/10/2022 in [facility] Behavioral Medicine at [facility]  Total GAD-7 Score 4 10 10 8 9    PHQ2-9    Flowsheet Row Office Visit from 01/16/2024 in [facility] at [facility] Office Visit from 11/02/2023 in [facility] at [facility] Counselor from 06/22/2023 in [facility] Behavioral Medicine at [facility] Counselor from 02/16/2023 in [facility] Behavioral  Medicine at [facility] Counselor from 11/23/2022 in [facility] Behavioral Medicine at [facility]  PHQ-2 Total Score 1 2 4 1 1   PHQ-9 Total Score 8 14 18 11  --   SBQ-R    Flowsheet Row Counselor from 06/22/2023 in [facility] Behavioral Medicine at [facility] Counselor from 08/04/2022 in [facility] Behavioral Medicine at [facility]  SBQ-R Total Score 10 8   Flowsheet Row Office Visit from 01/16/2024 in [facility] at [facility] Office Visit from 11/02/2023 in [facility] Behavioral Health at [facility] Office Visit from 08/15/2023 in [facility] Out[facility] Health at [facility]  C-SSRS RISK CATEGORY No Risk No Risk No Risk    Assessment and Plan: as follows   Prior documentation reviewed    Major depressive disorder recurrent moderate to severe; manageable continue wellbutrin . Denies worsening or suicidal thought  Tiredness: Using Cpap machine has helped tiredness and has lose weight Getting tested for ADHD online virtual tomorrow, discussed to share results   Generalized anxiety disorder;has left college that stress is over,stress is better and not want to be on buspar  Can use vistaril  prn for anxiety   OCD; baseline, continue therapy and looking to do with someone more familiar with gender dysphoria.     Follow up with PCP in regard to any concern and monitor BP , denies chest pain, palpitations   Direct care time spent  20 minutes with chart review, documentation elaboration and face-to-face  FU 2 - 3 m or earlier if needed  Collaboration of Care: Primary Care Provider AEB notes and chart reviewed  Patient/Guardian was advised Release of Information must be obtained prior to any record release in order to collaborate their care with an outside provider. Patient/Guardian was advised if they have not already done so to  contact the registration department to sign all necessary forms in order for us  to release information regarding their care.   Consent: Patient/Guardian gives verbal consent for treatment and assignment of benefits for services provided during this visit. Patient/Guardian expressed understanding and agreed to proceed.   Jackey Flight, MD 11/11/20253:13 PM

## 2024-01-17 ENCOUNTER — Ambulatory Visit (INDEPENDENT_AMBULATORY_CARE_PROVIDER_SITE_OTHER): Admitting: Psychology

## 2024-01-17 DIAGNOSIS — F89 Unspecified disorder of psychological development: Secondary | ICD-10-CM

## 2024-01-17 NOTE — Progress Notes (Addendum)
 Date: 01/17/2024 Appointment Start Time: 5:02pm Duration: 122 minutes Provider: Frederic Fire, PsyD Type of Session: Initial Appointment for Evaluation  Location of Patient: Home Location of Provider: Provider's Home (private office) Type of Contact: Caregility video visit with audio  Session Content:  Prior to proceeding with today's appointment, two pieces of identifying information were obtained from Jerry Cisneros to verify identity. In addition, Jerry Cisneros's physical location at the time of this appointment was obtained. In the event of technical difficulties, Jerry Cisneros shared a phone number he could be reached at. Jerry Cisneros and this provider participated in today's telepsychological service. Jerry Cisneros denied anyone else being present in the room or on the virtual appointment.  The provider's role was explained to Jerry Cisneros. The provider reviewed and discussed issues of confidentiality, privacy, and limits therein (e.g., reporting obligations). In addition to verbal informed consent, written informed consent for psychological services was obtained from Jerry Cisneros prior to the initial appointment. Written consent included information concerning the practice, financial arrangements, and confidentiality and patients' rights. Since the clinic is not a 24/7 crisis center, mental health emergency resources were shared, and the provider explained e-mail, voicemail, and/or other messaging systems should be utilized only for non-emergency reasons. This provider also explained that information obtained during appointments will be placed in their electronic medical record in a confidential manner. Jerry Cisneros verbally acknowledged understanding of the aforementioned and agreed to use mental health emergency resources discussed if needed. Moreover, Jerry Cisneros agreed information may be shared with other Jerry Cisneros or their referring provider(s) as needed for coordination of care. By signing the new patient documents, Jerry Cisneros provided written consent  for coordination of care. Jerry Cisneros verbally acknowledged understanding he is ultimately responsible for understanding his insurance benefits as it relates to reimbursement of telepsychological and in-person services. This provider also reviewed confidentiality, as it relates to telepsychological services, as well as the rationale for telepsychological services. This provider further explained that video should not be captured, photos should not be taken, nor should testing stimuli be copied or recorded as it would be a copyright violation. Jerry Cisneros expressed understanding of the aforementioned, and verbally consented to proceed. Jerry Cisneros is aware of the limitations of teleheath visits and verbally consented to proceed.  Jerry Cisneros completed the neurobehavioral examination, which included obtaining a, family, social, and psychiatric history; integration of prior history and other sources of clinical data to assist with clinical decision making; behavioral observations; assessment of thinking, reasoning, and judgment; and establishment of a provisional diagnosis. The evaluation was completed in 122 minutes. Codes [id number] and [id number] were billed.   Mental Status Examination:  Appearance:  neat Behavior: appropriate to circumstances Mood: neutral Affect: blunted Speech: tangential  Eye Contact: appropriate Psychomotor Activity: restless Thought Process: denies suicidal, homicidal, and self-harm ideation, plan and intent Content/Perceptual Disturbances: none Orientation: AAOx4 Cognition/Sensorium: intact Insight: fair Judgment: good  Provisional DSM-5 diagnosis(es):  F89 Unspecified Disorder of Psychological Development   Plan: Testing is expected to answer the question, does the individual meet criteria for ADHD when age, other mental health concerns (e.g., anxiety, depression, and autism spectrum disorder), and cognitive functioning are taken into consideration? Further testing is warranted because a diagnosis  cannot be given solely based on current interview data (further data is required). Testing results are expected to answer the remaining diagnostic questions in order to provide an accurate diagnosis and assist in treatment planning with an expectation of improved clinical outcome. Jerry Cisneros is currently scheduled for an appointment on 01/26/2024 at 3pm via Caregility video visit with audio.  CONFIDENTIAL PSYCHOLOGICAL EVALUATION ______________________________________________________________________________ Name: Jerry Cisneros Date of Birth: 2000/03/14                                                        Age: 23  SOURCE AND REASON FOR REFERRAL: Jerry Cisneros was referred by Jerry Cisneros, [facility], for an evaluation to ascertain if he meets the criteria for Attention Deficit/Hyperactivity Disorder (ADHD).                                                                                                    BACKGROUND INFORMATION AND PRESENTING PROBLEM: Jerry Cisneros is a 23 year old who resides in [place] .   Jerry Cisneros reported he "went through a[n] [ADHD evaluation] like this in 2021" and that "thinking back [he] really wish [his] mother Jerry Cisneros not involved in the evaluation process]," but he was a "minor" at that time. He endorsed the following ADHD-related symptoms:   Symptoms Frequency    Often Occasionally Infrequent/No Significant Issues Inattention Criterion (DSM-5-TR)    Making careless mistakes and missing small details. He shared he is "kind of clumsy" and prone to making mistakes, which he noted attentional dysregulation seems to contribute to.   Being easily distracted by various stimuli and the mind often being elsewhere even when no apparent distractions exist. He stated he is easily distracted by various stimuli (e.g., irrelevant tasks, visuals, and sounds), adding that this is especially likely to occur if he is "feeling overwhelmed" by a task and/or the task  "instructions just are not very clear for [him]." He also stated he can experience periods of hyperfocus on tasks he "enjoys" (e.g., video games and "YouTube shorts") to an extent that involves time blindness and spending significantly more time than planned or is beneficial on them and being prone to losing track of events occurring around him.   Trouble sustaining attention during conversations.  He noted this symptom occurs occasionally.  Does not follow through on instructions and fails to finish tasks. He endorsed regularly having task initiation and maintenance issues.   Difficulty organizing tasks and activities. He discussed commonly having clutter in his environment, although noted he prioritizes organization at his employment; haphazardly completing tasks; and trouble making and maintaining routines and plans.   Avoids, dislikes, or is reluctant to engage in tasks that require sustained mental effort. He noted this symptom often occurs.   Loses things necessary for tasks or activities. He reported commonly forgetting items needed for tasks.   Forgetful in daily activities. He also reported a tendency to forget various daily activities (e.g., plans and to return phone calls or texts).   Hyperactivity and Impulsivity Criterion (DSM-5-TR)    Fidgets with hands or feet or squirms in seat. He described habitually, and largely unconsciously, fidgeting or squirming, adding that "stay[ing] still" requires a consistent conscious effort.   Leaves seat in situations in which remaining seated is expected.   He denied  experiencing significant issues with this symptom. Feelings of restlessness. He discussed regularly thinking of things he "needs to be doing.   Difficulty playing or engaging in leisure activities quietly. He endorsed a tendency to "hum" or sing quietly while [he is] doing stuff" or while playing video games while others are around, sharing that it is "self-stimulatory behavior."   "On the  go" or often acts as if "driven by a motor." He indicated being tendency to move quickly through tasks, which others have commented on.   Talks excessively.  He reported occasionally talking excessively.  Interrupts others.   He denied experiencing significant issues with this symptom. Impatience.   He denied experiencing significant issues with this symptom. ADHD-Related Symptoms that Assist in Identifying ADHD but are not in the DSM-5-TR Jerry Cisneros, 2021, p. 6-12 and [phone])    Emotional dysregulation and overstimulation.  He expressed a belief he can largely regulate his emotions, although shared examples of being prone to upset and/or overwhelm if an obstacle to a routine occurs, if someone interrupts him while he is focused on a task, or if multiple people are trying to talk to him at one time.  Making decisions impulsively. He indicated gravitating toward "immediate gratification," which negatively impacts his ability to stick with and complete long-term goals, noting examples of a tendency to make purchases or say things he later regrets. He also noted how trouble with planning contributes to impulsive spending.   Tending to drive much faster than others.  He discussed how inattention to the speed he is driving and/or him "running late" can cause him to drive significantly over the speed limit, but that use of cruise control has reduced the frequency of him speeding.  Trouble following through on promises or commitments made to others. He described this symptom as regularly occurring, and noted how forgetfulness contributes to it.   Trouble doing things in proper order or sequence.   He denied experiencing significant issues with this symptom.   He discussed a history of    He expressed a belief that his ADHD-related symptoms have occurred since childhood, are largely consistent across situations, and are independent of mood. However, he noted how mood can exacerbate them. He stated his coping and  compensatory strategies include utilizing GPS to assist in tracking the speed he is driving, use of cruise control to reduce the occurrence of speeding, use of reminders and having a designated spot for items to reduce forgetfulness, and taking notes during lecture to assist in sustaining his attention and reducing forgetfulness.    Mr. Jerry Cisneros denied awareness of having ever experienced any developmental milestone delays, grade retention, learning disability diagnosis, or having an individualized education plan. He discussed having natural academic strengths which helped him overcome the adverse effects of having trouble completing homework and "outside of class projects" "up until high school." He reported that he graduated from high school with a "4.0" GPA, but that in high school he "procrastinated more" and "cheat[ed]" on class assignments. He further reported that he tended to have significantly more difficulty with classes he was not interested in. He noted he was "expelled from [employer]" in middle school secondary to relational conflicts with another classmate. He denied having any other significant behavioral issues in primary and secondary schooling. He stated he "dropped out" of college, which he attributed to it being "hard to keep up" with the various class responsibilities, to adjust to "living on [his] own," "keep[ing] [himself] accountable," he tended to skip class,  and because he procrastinated sending out college applications to an extent that "by the time [he] did" his "only offer" was for "a school I didn't really want to go to." He shared that he is currently in community college, adding that it was "hard to keep up with three" accelerated courses at a time but that he can generally "keep up with one [accelerated course]." He also shared that others' perceptions of him contribute to academic difficulties. He denied having a significant employment disciplinary history, noting that  he is struggling to find employment he enjoys and is considering becoming a lawyer. He endorsed being in romantic relationships with multiple people. He said that in the past he was "too self-sacrificing" and had difficulty "setting boundaries," but that he has "been working on it."   Chart Review: Per a 03/11/2021 telephone encounter note, Jerry Cisneros notified Jerry Cisneros that "[Jashon's] mom called and was requesting a referral to [facility] for ADHD evaluation for Ameen."    Per a 05/18/2021 progress note, Jerry Cisneros, Jerry Cisneros, reported "[Jerry Cisneros] reports that he spoke with w/his advisors at school and he was told that he should get tested for ADHD."    Per a 05/18/2021 progress note, Jerry Cisneros noted "Jerry Cisneros presents for difficulties in school. He said since he was little before even starting school he remembers that he was always fidgety and moving all the time to the point that sometimes the teachers felt like it was disruptive.  He is always made good grades in fact he is currently in college and has a full scholarship where he is out.  He reports that sometimes he has difficulty focusing but it is mostly when he gets home from school when he tries to get his work done he just feels like he is cannot quite focus and get on task and get things done.  He feels like during class time he is actually able to feel focus on the teacher speaking.  No known family history of ADHD he says that he thinks maybe his uncle has it but he does not know if he has been formally tested.  He sometimes does interrupt people when they are talking.  He often has trouble focusing on details and actually completing a project.  He often has difficulty getting organized and remembering appointments and obligations.  In fact he had made an appointment to speak with a therapist and actually completely forgot about it and missed his appointment even though he had set a reminder."  Dr. Byars also noted the ASRSv1.1 was administered, and that "[Adian] had 5 positive questions in part a and 5 positive questions in part B. He reports that his symptoms started before school age. We will place referral for formal evaluation. The diagnosis was a little bit more challenging because he is also having some concomitant depression and anxiety. But that is also in part due to not being as successful in school as he would like to be."    Per a 02/10/2022 appointment note, Jerry Cisneros, [facility], reported that Reilly expressed a belief "he thinks he needs to be tested [for ADHD]."   Per the 11/09/2023 appointment note, Jerry Cisneros, [facility], administered the ASRS, and Ayub scored 5/6 on Part A and 3/12 on Part B, indicating a positive screening for ADHD. Ms. 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

## 2024-01-18 ENCOUNTER — Ambulatory Visit: Admitting: Professional

## 2024-01-25 ENCOUNTER — Ambulatory Visit: Admitting: Professional

## 2024-01-25 ENCOUNTER — Encounter: Payer: Self-pay | Admitting: Professional

## 2024-01-25 DIAGNOSIS — F321 Major depressive disorder, single episode, moderate: Secondary | ICD-10-CM | POA: Diagnosis not present

## 2024-01-25 DIAGNOSIS — F429 Obsessive-compulsive disorder, unspecified: Secondary | ICD-10-CM

## 2024-01-25 NOTE — Progress Notes (Signed)
 [facility] Counselor/Therapist Progress Note  Patient ID: Altin, Sease  MRN: [id number],    Date: 01/25/2024  Time Spent: 53 minutes [id number]  Subjective: The patient arrived on time for his in-person appointment.  Issues addressed: 1-homework a-pt kept testing appt with Dr. 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 [facility] -he has a drug screen to complete and then he will be eligible to substitute -he completed the training 3-testing with Dr Fire -he had initial two hour visit -he has two additional appointments scheduled 4-confidence -pt went to a salon and got a more feminine haircut and got his eye brows done -he is curious about clothes and 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 Alleviate depressive symptoms and return to previous level of effective functioning. Objective Describe current and past experiences with depression including their impact on functioning and attempts to resolve it. Target Date: 2024-04-05 Frequency: biweekly  Progress: 100 Modality: individual  Related Interventions Encourage the client to share his/her  thoughts and feelings of depression; express empathy and build rapport while identifying primary cognitive, behavioral, interpersonal, or other contributors to depression. Assess current and past mood episodes including their features, frequency, severity, and duration (e.g., clinical interview supplemented by the Inventory to Diagnose Depression). Objective Identify and replace thoughts and beliefs that support depression. Target Date: 2024-04-05 Frequency: biweekly  Progress: 70 Modality: individual  Related Interventions Conduct Cognitive-Behavioral Therapy (see Cognitive Behavior Therapy by Almarie; Overcoming Depression by Marine dunker al.), beginning with helping the client learn the connection among cognition, depressive feelings, and actions. Assign the client to self-monitor thoughts, feelings, and actions in daily journal (e.g., Negative Thoughts Trigger Negative Feelings in the Adult Psychotherapy Homework Planner by Jenniffer; Daily Record of Dysfunctional Thoughts in Cognitive Therapy of Depression by Almarie Candida Gentry and Shona); process the journal material to challenge depressive thinking patterns and replace them with reality-based thoughts. Assign behavioral experiments in which depressive automatic thoughts are treated as hypotheses/prediction, reality-based alternative hypotheses/prediction are generated, and both are tested against the client's past, present, and/or future experiences. Facilitate and reinforce the client's shift from biased depressive self-talk and beliefs to reality-based cognitive messages that enhance self-confidence and increase adaptive actions (see Positive Self-Talk in the Adult Psychotherapy Homework Planner by Jenniffer). Objective Learn and implement behavioral strategies to overcome depression. Target Date: 2024-04-05 Frequency: biweekly  Progress: 40 Modality: individual  Related Interventions Engage the client in behavioral activation, increasing  his/her activity level and contact with sources of reward, while identifying processes that inhibit activation (see Behavioral Activation for Depression by Loleta, Dimidjian, and Herman-Dunn; or assign Identify and Schedule Pleasant Activities in the Adult Psychotherapy Homework Planner by [facility]); use behavioral techniques such as instruction, rehearsal, role-playing, role reversal, as needed,  to facilitate activity in the client's daily life; reinforce success. Assist the client in developing skills that increase the likelihood of deriving pleasure from behavioral activation (e.g., assertiveness skills, developing an exercise plan, less internal/more external focus, increased social involvement); reinforce success. Objective Verbalize an understanding and resolution of current interpersonal problems. Target Date: 2024-04-05 Frequency: biweekly  Progress: 60 Modality: individual  Related Interventions For role transitions (e.g., beginning or ending a relationship or career, moving, promotion, retirement, graduation), help the client mourn the loss of the old role while recognizing positive and negative aspects of the new role, and taking steps to gain mastery over the new role. Objective Learn and implement problem-solving and decision-making skills. Target Date: 2024-04-05 Frequency: biweekly  Progress: 40 Modality: individual  Objective Learn and implement conflict resolution skills to resolve interpersonal problems. Target Date: 2024-04-05 Frequency: biweekly  Progress: 50 Modality: individual  Related Interventions Teach conflict resolution skills (e.g., empathy, active listening, I messages, respectful communication, assertiveness without aggression, compromise); use psychoeducation, modeling, role-playing, and rehearsal to work through several current conflicts; assign homework exercises; review and repeat so as to integrate their use into the client's life. 2. Appropriately grieve the  loss in order to normalize mood and to return to previously adaptive level of functioning. 3. Decrease the level of present conflict with parents while beginning to let go of or resolving past conflicts with them. Objective Describe the conflicts and the causes of conflicts between self and parents. Target Date: 2024-04-05 Frequency: biweekly  Progress: 80 Modality: individual  Related Interventions Give verbal permission for the client to have and express own feelings, thoughts, and perspectives in order to foster a sense of autonomy from family. Explore the nature of the client's family conflicts and their perceived causes. Objective Identify own as well as others' role in the family conflicts. Target Date: 2024-04-05 Frequency: biweekly  Progress: 40 Modality: individual  Related Interventions Confront the client when he/she is not taking responsibility for his/her role in the family conflict and reinforce the client for owning responsibility for his/her contribution to the conflict. Objective Increase the level of independent functioning. Target Date: 2024-04-05 Frequency: biweekly  Progress: 50 Modality: individual  Related Interventions Confront the client's emotional dependence and avoidance of economic responsibility that promotes continuing pattern of living with parents; develop a plan for the client's healthy and responsible emancipation from parents that is, if possible, complete with their blessing (e.g., finding and keeping a job, saving money, socializing with friends, finding own housing, etc.). Objective Report an increase in resolving conflicts with parents by talking calmly and assertively rather than aggressively and defensively. Target Date: 2024-04-05 Frequency: biweekly  Progress: 30 Modality: individual  Related Interventions Use role-playing, role reversal, modeling, and behavioral rehearsal to help the client develop assertive ways to resolve conflict with parents  (recommend Your Perfect Right: Assertiveness and Equality in Your Life and Relationships by Darrelyn armin Sick). 4. Develop coping strategies (e.g., feeling identification, problem-solving, assertiveness) to address emotional issues that could lead to relapse of the eating disorder. 5. Develop healthy cognitive patterns and beliefs about self that lead to positive identity and prevent a relapse of the eating disorder. 6. Develop healthy interpersonal relationships that lead to alleviation and help prevent the relapse of the eating disorder. 7. Develop healthy interpersonal relationships that lead to the alleviation and help prevent the relapse of depression. 8. Develop healthy thinking patterns and beliefs about self, others, and the world that lead to the alleviation and help prevent the relapse of  depression. 9. Enhance ability to effectively cope with the full variety of life's worries and anxieties. 10. Learn and implement coping skills that result in a reduction of anxiety and worry, and improved daily functioning. 11. Reach a level of reduced tension, increased satisfaction, and improved communication with family and/or other authority figures. 12. Recognize, accept, and cope with feelings of depression. 13. Restore normal eating patterns, healthy weight maintenance, and a realistic appraisal of body size. Objective Honestly describe the pattern of eating including types, amounts, and frequency of food consumed or hoarded. Target Date: 2024-04-05 Frequency: biweekly  Progress: 70 Modality: individual  Related Interventions Assess the historical course of the disorder including the amount, type, and pattern of the client's food intake (e.g., too little food, too much food, binge eating, or hoarding food); perceived personal and interpersonal triggers and personal goals. Objective Establish regular eating patterns by eating at regular intervals and consuming optimal daily calories. Target Date:  2024-04-05 Frequency: biweekly  Progress: 50 Modality: individual  Related Interventions Establish healthy weight goals for the client per the Body Mass Index (BMI), the [place] Height and Weight Tables, or some other recognized standard. Objective Learn and implement skills for managing urges to engage in unhealthy eating or weight loss practices. Target Date: 2024-04-05 Frequency: biweekly  Progress: 50 Modality: individual  Related Interventions Teach the client tailored skills to manage high-risk situations including distraction, positive self-talk, problem-solving, conflict resolution (e.g., empathy, active listening, I messages, respectful communication, assertiveness without aggression, compromise), or other social/ communication skills; use modeling, role-playing, and behavior rehearsal to work through several current situations. Objective State a basis for positive identity that is not based on weight and appearance but on character, traits, relationships, and intrinsic value. Target Date: 2024-04-05 Frequency: biweekly  Progress: 60 Modality: individual  Related Interventions Assist the client in identifying a basis for self-worth apart from body image by reviewing his/her talents, successes, positive traits, importance to others, and intrinsic spiritual value. Objective Verbalize an understanding of relapse prevention and the distinction between a lapse and a relapse. Target Date: 2024-04-05 Frequency: biweekly  Progress: 30 Modality: individual  Related Interventions Discuss with the client the distinction between a lapse and relapse, associating a lapse with an initial and reversible return of distress, urges, or to avoid, and relapse with the decision to return to the cycle of maladaptive thoughts and actions (e.g., feeling anxious, binging, then purging). Identify with the client future situations or circumstances in which lapses could occur. 14. Stabilize anxiety level while  increasing ability to function on a daily basis. Objective Learn and implement problem-solving strategies for realistically addressing worries. Target Date: 2024-04-05 Frequency: biweekly  Progress: 30 Modality: individual  Related Interventions Teach the client problem-solving strategies involving specifically defining a problem, generating options for addressing it, evaluating the pros and cons of each option, selecting and implementing an optional action, and reevaluating and refining the action (or assign Applying Problem-Solving to Interpersonal Conflict in the Adult Psychotherapy Homework Planner by Jenniffer). Objective Identify, challenge, and replace biased, fearful self-talk with positive, realistic, and empowering self-talk. Target Date: 2024-04-05 Frequency: biweekly  Progress: 50 Modality: individual  Related Interventions Explore the client's schema and self-talk that mediate his/her fear response; assist him/her in challenging the biases; replace the distorted messages with reality-based alternatives and positive, realistic self-talk that will increase his/her self-confidence in coping with irrational fears (see Cognitive Therapy of Anxiety Disorders by Gretta armin Mon). Assign the client a homework exercise in which he/she identifies fearful self-talk, identifies  biases in the self-talk, generates alternatives, and tests through behavioral experiments (or assign Negative Thoughts Trigger Negative Feelings in the Adult Psychotherapy Homework Planner by [facility]); review and reinforce success, providing corrective feedback toward improvement. Objective Learn to accept limitations in life and commit to tolerating, rather than avoiding, unpleasant emotions while accomplishing meaningful goals. Target Date: 2024-04-05 Frequency: biweekly  Progress: 40 Modality: individual  Related Interventions Use techniques from Acceptance and Commitment Therapy to help client accept uncomfortable  realities such as lack of complete control, imperfections, and uncertainty and tolerate unpleasant emotions and thoughts in order to accomplish value-consistent goals. Objective Learn and implement personal and interpersonal skills to reduce anxiety and improve interpersonal relationships. Target Date: 2024-04-05 Frequency: biweekly  Progress: 60 Modality: individual  Related Interventions Use instruction, modeling, and role-playing to build the client's general social, communication, and/or conflict resolution skills. Objective Maintain involvement in work, family, and social activities. Target Date: 2024-04-05 Frequency: biweekly  Progress: 60 Modality: individual  Related Interventions Support the client in following through with work, family, and social activities rather than escaping or avoiding them to focus on anxiety. 15. Terminate overeating and implement lifestyle changes that lead to weight loss and improved health. 16. Terminate the pattern of binge eating and purging behavior with a return to eating normal amounts of nutritious foods.  Diagnosis:MDD (major depressive disorder), single episode, moderate (HCC)  Obsessive-compulsive disorder, unspecified type  Rule In/Out ADHD  Plan:  -keep testing appt with Dr. Frederic Fire on January 26, 2024 -meet again on Thursday, February 08, 2024 at 3pm in person.

## 2024-01-26 ENCOUNTER — Ambulatory Visit (INDEPENDENT_AMBULATORY_CARE_PROVIDER_SITE_OTHER): Admitting: Psychology

## 2024-01-26 DIAGNOSIS — F89 Unspecified disorder of psychological development: Secondary | ICD-10-CM

## 2024-01-26 NOTE — Progress Notes (Addendum)
 Date: 01/26/2024   Appointment Start Time: 3-3:45 (psychodiagnostic interview) and 3:45-4:30pm (WAIS-5 administration) Duration: 90 total minutes Provider: Frederic Fire, PsyD Type of Session: Testing Appointment for Evaluation  Location of Patient: Home Location of Provider: Provider's Home (private office) Type of Contact: Caregility video visit with audio  Session Content: Today's appointment was a telepsychological visit. Jerry Cisneros is aware it is his responsibility to secure confidentiality on his end of the session. Prior to proceeding with today's appointment, Jerry Cisneros's physical location at the time of this appointment was obtained as well a phone number he could be reached at in the event of technical difficulties. Jerry Cisneros denied anyone else being present in the room or on the virtual appointment. This provider reviewed that video should not be captured, photos should not be taken, nor should testing stimuli be copied or recorded as it would be a copyright violation. Jerry Cisneros expressed understanding of the aforementioned, and verbally consented to proceed. The WAIS-5 was administered, scored, and interpreted by this evaluator. Jerry Cisneros is aware of the limitations of teleheath visits and verbally consented to proceed.  Jerry Cisneros completed the psychiatric diagnostic evaluation (history, including past, family, social, and  psychiatric history; behavioral observations; and establishment of a provisional diagnosis). The evaluation was completed in 45 minutes. Code 09208 was billed. See the evaluation below for additional information.   Billing codes for report writing, test administration, scoring, and interpreting will be input on the feedback appointment.   Provisional DSM-5 diagnosis(es):  F89 Unspecified Disorder of Psychological Development  Plan: [name] was scheduled for a feedback appointment on 02/08/2024 at 11am via Caregility video visit with audio.     CONFIDENTIAL PSYCHOLOGICAL  EVALUATION ______________________________________________________________________________ Name: Jerry Cisneros Date of Birth: 2000/10/14                                                        Age: 23   SOURCE AND REASON FOR REFERRAL: Jerry Cisneros was referred by Jerry Cisneros, [facility], for an evaluation to ascertain if he meets the criteria for Attention Deficit/Hyperactivity Disorder (ADHD).                                                                                                    BACKGROUND INFORMATION AND PRESENTING PROBLEM: Jerry Cisneros is a 23 year old who resides in [name] .   Jerry Cisneros reported he "went through a[n] [ADHD evaluation] like this in 2021" and that "thinking back [he] really wish [his] mother Jerry Cisneros not involved in the evaluation process]," but he was a "minor" at that time. He endorsed the following ADHD-related symptoms:   Symptoms Frequency    Often Occasionally Infrequent/No Significant Issues Inattention Criterion (DSM-5-TR)    Making careless mistakes and missing small details. He shared he is "kind of clumsy" and prone to making mistakes, which he noted attentional dysregulation seems to contribute to.   Being easily distracted by various stimuli and the mind often being elsewhere even  when no apparent distractions exist. He stated he is easily distracted by various stimuli (e.g., irrelevant tasks, visuals, and sounds), adding that this is especially likely to occur if he is "feeling overwhelmed" by a task and/or the task "instructions just are not very clear for [him]." He also stated he can experience periods of hyperfocus on tasks he "enjoys" (e.g., video games and "YouTube shorts") to an extent that involves time blindness and spending significantly more time than planned or is beneficial on them and being prone to losing track of events occurring around him.   Trouble sustaining attention during conversations.  He noted this symptom occurs  occasionally.  Does not follow through on instructions and fails to finish tasks. He endorsed regularly having task initiation and maintenance issues.   Difficulty organizing tasks and activities. He discussed commonly having clutter in his environment, although noted he prioritizes organization at his employment; haphazardly completing tasks; and trouble making and maintaining routines and plans.   Avoids, dislikes, or is reluctant to engage in tasks that require sustained mental effort. He noted this symptom often occurs.   Loses things necessary for tasks or activities. He reported commonly forgetting items needed for tasks.   Forgetful in daily activities. He also reported a tendency to forget various daily activities (e.g., plans and to return phone calls or texts).   Hyperactivity and Impulsivity Criterion (DSM-5-TR)    Fidgets with hands or feet or squirms in seat. He described habitually, and largely unconsciously, fidgeting or squirming, adding that "stay[ing] still" requires a consistent conscious effort.   Leaves seat in situations in which remaining seated is expected.   He denied experiencing significant issues with this symptom. Feelings of restlessness. He discussed regularly thinking of things he "needs to be doing.   Difficulty playing or engaging in leisure activities quietly. He endorsed a tendency to "hum" or sing quietly while [he is] doing stuff" or while playing video games while others are around, sharing that it is "self-stimulatory behavior."   "On the go" or often acts as if "driven by a motor." He indicated being tendency to move quickly through tasks, which others have commented on.   Talks excessively.  He reported occasionally talking excessively.  Interrupts others.   He denied experiencing significant issues with this symptom. Impatience.   He denied experiencing significant issues with this symptom. ADHD-Related Symptoms that Assist in Identifying ADHD but are not in  the DSM-5-TR Jeanna, 2021, p. 6-12 and [phone])    Emotional dysregulation and overstimulation.  He expressed a belief he can largely regulate his emotions, although shared examples of being prone to upset and/or overwhelm if an obstacle to a routine occurs, if someone interrupts him while he is focused on a task, or if multiple people are trying to talk to him at one time.  Making decisions impulsively. He indicated gravitating toward "immediate gratification," which negatively impacts his ability to stick with and complete long-term goals, noting examples of a tendency to make purchases or say things he later regrets. He also noted how trouble with planning contributes to impulsive spending.   Tending to drive much faster than others.  He discussed how inattention to the speed he is driving and/or him "running late" can cause him to drive significantly over the speed limit, but that use of cruise control has reduced the frequency of him speeding.  Trouble following through on promises or commitments made to others. He described this symptom as regularly occurring, and noted how forgetfulness contributes  to it.   Trouble doing things in proper order or sequence.   He denied experiencing significant issues with this symptom.   Mr. Amrhein discussed a history of autism spectrum disorder-related symptomatology, including demonstrating trouble inferring what information this evaluator's question was asking for (e.g., regularly asking this evaluator for clarification on what his question meant); difficulty starting and maintaining conversations with people he does not know well; trouble "relat[ing]" with others, being a "pretty reserved person," and using an "analytical approach to determine how someone is feeling" and what their "energy level" is; "generally hav[ing] a pretty flat affect;" and sensory sensitivity (e.g., the pressure from a "blood pressure cuff" being perceived as extremely painful). He also  discussed a history of depressive episodes that include depressed mood, reduced drive, and psychomotor slowing, that he indicated commonly occur secondary to ADHD-related difficulties, academic problems, and/or the "weather;" social anxiety-related symptoms (e.g., regularly having concerns about how others are perceiving him, which can lead to avoiding social settings) that he noted he was working to address by increasingly spending time with "like-minded" others; hypervigilance- and anxiety-related symptomatology (e.g., feeling "on edge" and "jumpy") while at home, which he attributed to unspecified behaviors from his father, having limited privacy, and his mother "view[ing] certain things as satanic;" sleep apnea and sleep maintenance problems, as well as regularly tossing and turning in his sleep; binge eating-related behaviors (e.g., "eating an entire restaurant meal, and then eating another full meal and a milkshake), and that "three times" in the past he "vomited to get rid of the calories;" fleeting suicidal ideation (e.g., "I should kill myself) without plan or intent, although he noted awareness of the importance of reaching out to trusted others and/or emergency services should he experience a mental health crisis as well as confidence in his ability to do so;  He endorsed intrusive and ruminative thoughts that he "would rather not have," and that contribute to "shame and guilt," depression-related symptoms and self-critical thoughts, and engaging in behaviors with a high potential for adverse consequences, adding that he finds himself acting on them "impulsively without thinking" and that he enjoys the "attention" and discomfort acting in this manner can cause others; however, he noted that use of dietician services seemed to have helped in reducing the aforementioned occurrence.    He expressed a belief that his ADHD-related symptoms have occurred since childhood, are largely consistent across  situations, and are independent of mood. However, he noted how mood can exacerbate them. He stated his coping and compensatory strategies include using GPS to track his speed, using cruise control to reduce speeding, using reminders, having a designated spot for items to reduce forgetfulness, and taking notes during lectures to help sustain his attention and reduce forgetfulness.    Mr. Lundberg denied awareness of having ever experienced any developmental milestone delays, grade retention, learning disability diagnosis, or having an individualized education plan. He discussed having natural academic strengths which helped him overcome the adverse effects of having trouble completing homework and "outside of class projects" "up until high school." He reported that he graduated from high school with a "4.0" GPA, but that he "procrastinated more" and "cheat[ed]" on class assignments. He further reported that he tended to have significantly more difficulty with classes he was not interested in. He noted he was "expelled from [employer]" in middle school secondary to relational conflicts with another classmate. He denied having any other significant behavioral issues in primary and secondary schooling. He stated he "dropped out" of college,  which he attributed to it being "hard to keep up" with the various class responsibilities, to adjust to "living on [his] own," "keep[ing] [himself] accountable," he tended to skip class, and because he procrastinated sending out college applications to an extent that "by the time [he] did" his "only offer" was for "a school I didn't really want to go to." He shared that he is currently in community college, adding that it was "hard to keep up with three" accelerated courses at a time but that he can generally "keep up with one [accelerated course]." He also shared that others' perceptions of him contribute to academic difficulties. He denied having a significant employment  disciplinary history, noting that he is struggling to find employment he enjoys and is considering becoming a lawyer. He endorsed being in romantic relationships with multiple people. He said that in the past he was "too self-sacrificing" and had difficulty "setting boundaries," but that he has "been working on it."   Mr. Tata reported his medical history is significant for sleep apnea with use of CPAP and weight fluctuations secondary to "binge eating," adding he previously utilized dietician services, but was prone to "missing appointments" as he did not "receive appointment reminders. He stated that as a child, he had seizures for an unknown reason that seemingly ceased at the age of 69. He denied awareness of experiencing lingering effects from the seizures or having ever experienced a head injury. He reported previously having utilized "PHP" and "IOP" mental health services for "stress at school" that "was very overwhelming, as well as that he is currently utilizing outpatient psychotherapeutic services that were initially started for "depression," "isolation," and "not doing well in school," but that since this time he has been finding "like-minded people," which has been helpful for his wellbeing. He also reported he is currently using Wellbutrin , but that it has been limited in its efficacy, adding that past use of Buspirone  and Hydroxyzine  was also "not very helpful." He endorsed infrequent use of 12-20oz of soda but denied the use of all other recreational or illicit substances. He also denied ever meeting full criteria for hypomanic or manic episode; psychosis; trauma and stressor-related disorder; homicidal ideation, plan, or intent; or legal involvement. He stated his familial mental health is significant for having utilized psychotherapeutic services for an unknown reason (father), "severe mental health issues" and being "in and out" of psychiatric hospitalization (paternal great  uncle), and possible ADHD and having a "bad stutter" (brother).    Chart Review: Per a 03/11/2021 telephone encounter note, Dorthea Graves notified Dr. Dorothyann Byars that "[Arvell's] mom called and was requesting a referral to [facility] for ADHD evaluation for Jaymar."    Per a 05/18/2021 progress note, Bascom Pica, CMA, reported "[Kaya] reports that he spoke with w/his advisors at school and he was told that he should get tested for ADHD."    Per a 05/18/2021 progress note, Dr. Dorothyann Byars noted "Tedric Leeth presents for difficulties in school. He said since he was little before even starting school he remembers that he was always fidgety and moving all the time to the point that sometimes the teachers felt like it was disruptive.  He is always made good grades in fact he is currently in college and has a full scholarship where he is out.  He reports that sometimes he has difficulty focusing but it is mostly when he gets home from school when he tries to get his work done he just feels like he  is cannot quite focus and get on task and get things done.  He feels like during class time he is actually able to feel focus on the teacher speaking.  No known family history of ADHD he says that he thinks maybe his uncle has it but he does not know if he has been formally tested.  He sometimes does interrupt people when they are talking.  He often has trouble focusing on details and actually completing a project.  He often has difficulty getting organized and remembering appointments and obligations.  In fact he had made an appointment to speak with a therapist and actually completely forgot about it and missed his appointment even though he had set a reminder." Dr. Alvan also noted the ASRSv1.1 was administered, and that "[Doyl] had 5 positive questions in part a and 5 positive questions in part B. He reports that his symptoms started before school age. We will place referral for  formal evaluation. The diagnosis was a little bit more challenging because he is also having some concomitant depression and anxiety. But that is also in part due to not being as successful in school as he would like to be."    Per a 02/10/2022 appointment note, Jerry Cisneros, [facility], reported that Taiga expressed a belief "he thinks he needs to be tested [for ADHD]."   Per the 11/09/2023 appointment note, Jerry Cisneros, [facility], administered the ASRS, and Arlind scored 5/6 on Part A and 3/12 on Part B, indicating a positive screening for ADHD. Ms. 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

## 2024-02-08 ENCOUNTER — Ambulatory Visit: Admitting: Psychology

## 2024-02-08 ENCOUNTER — Ambulatory Visit: Admitting: Professional

## 2024-02-15 ENCOUNTER — Ambulatory Visit: Admitting: Psychology

## 2024-02-15 DIAGNOSIS — F900 Attention-deficit hyperactivity disorder, predominantly inattentive type: Secondary | ICD-10-CM

## 2024-02-15 NOTE — Progress Notes (Signed)
 Testing and Report Writing Information: The following measures  were administered, scored, and interpreted by this provider:  Generalized Anxiety Disorder-7 (GAD-7; 5 minutes), Patient Health Questionnaire-9 (PHQ-9; 5 minutes), Wechsler Adult Intelligence Scale-Fourth Edition (WAIS-5; 70 minutes), CNS Vital Signs (45 minutes), Adult Attention Deficit/Hyperactivity Disorder Self-Report Scale Checklist (ASRSv1.1; 15 minutes), Behavior Rating Inventory for Executive Function - 2A - Self Report (BRIEF 2A; 20 minutes) and Behavior Rating Inventory for Executive Function - 2A - Informant, (BRIEF-2A; 20 minutes), Social Responsiveness Scale-2 (20 minutes) and Personality Assessment Inventory (PAI; 50 minutes). A total of 250  minutes was spent on the administration and scoring of the aforementioned measures. Codes 03863 and [phone] (7 units) were billed.  Please see the assessment for additional details. This provider completed the written report which includes integration of patient data, interpretation of standardized test results, interpretation of clinical data, review of information provided by Jerry and any collateral information/documentation, and clinical decision making (395 minutes in total).  Feedback Appointment: Date: 02/15/2024 Appointment Start Time: 2:05 Duration: 60 minutes Provider: Frederic Fire, PsyD Type of Session: Feedback Appointment for Evaluation  Location of Patient: Home Location of Provider: Provider's Home (private office) Type of Contact: Caregility video visit with audio  Session Content: Today's appointment was a telepsychological visit due to COVID-19. Jerry Cisneros is aware it is his responsibility to secure confidentiality on his end of the session. He provided verbal consent to proceed with today's appointment. Prior to proceeding with today's appointment, Jerry Cisneros's physical location at the time of this appointment was obtained as well a phone number he could be reached at in the  event of technical difficulties. Jerry Cisneros denied anyone else being present in the room or on the virtual appointment. Jerry Cisneros is aware of the limitations of teleheath visits and verbally consented to proceed.  This provider and Ira completed the interactive feedback session which includes reviewing the aforementioned measures, treatment recommendations, and diagnostic conclusions.   The interactive feedback session was completed today and a total of 60 minutes was spent on feedback. Code 03867 was billed for feedback session.   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 01/26/2024: 5:15-5:45pm. 02/01/2024: 9:35-11am. 02/04/2024: 10:30-12:10pm and 1:45-2:30pm. 02/06/2024: 5:05-6:30pm and 8:35-9pm. 02/15/2024: 3:10-3:35pm. (Billing code 03868 [7 units])   Plan: Jerry provided verbal consent for his evaluation to be sent via e-mail. No further follow-up planned by this provider.      CONFIDENTIAL PSYCHOLOGICAL EVALUATION ______________________________________________________________________________ Name: Jerry Cisneros Date of Birth: 04-05-2000    Age: 23 Dates of Evaluation: 01/17/2024, 01/18/2024, 01/23/2024, and 01/26/2024  SOURCE AND REASON FOR REFERRAL: Monterio Bob was referred by Nathanel Collet, [facility], for an evaluation to ascertain if she meets the criteria for Attention Deficit/Hyperactivity Disorder (ADHD). Tony expressed a preference for she/Jerry Cisneros pronouns. As such, she/Jerry Cisneros pronouns were utilized throughout the evaluation.   EVALUATIVE PROCEDURES: Clinical Interview with Jerry Cisneros (01/17/2024 and 01/26/2024) Wechsler Adult Intelligence Scale-Fourth Edition (WAIS-5; 01/26/2024) CNS Vital Signs  (01/23/2024) Adult Attention Deficit/Hyperactivity Disorder Self-Report Scale Checklist (01/23/2024) Behavior Rating Inventory for Executive Function - A - Self Report Behavior Rating Inventory for Executive Function - 2A - Self Report (BRIEF-2A; 01/18/2024) and Informant (01/18/2024) Personality Assessment Inventory (PAI; 01/23/2024) Patient Health Questionnaire-9 (PHQ-9) Generalized Anxiety Disorder-7 (GAD-7) Social Responsiveness Scale-2 (SRS-2; 01/23/2024)   BACKGROUND INFORMATION AND PRESENTING PROBLEM: [name] is a 23 year old  who resides in [place] .  [name] reported she went through a[n] [ADHD evaluation] like this in 2021 and that thinking back [she] really wish[ed] [Jerry Cisneros] mother waymon not involved in the evaluation process], but she was a minor at that time. She endorsed experiencing the following ADHD-related symptoms:  Symptoms Frequency   Often Occasionally Infrequent/No Significant Issues  Inattention Criterion (DSM-5-TR)     Making careless mistakes and missing small details.  Shee shared that she is kind of clumsy and prone to making mistakes, and noted that attentional dysregulation seems to contribute to them.     Being easily distracted by various stimuli and the mind often being elsewhere, even when no apparent distractions exist.  She stated she is easily distracted by various stimuli (e.g., irrelevant tasks, visuals, and sounds), adding that this is especially likely to occur if she is feeling overwhelmed by a task and/or the task instructions just are not very clear for [Jerry Cisneros]. She also stated She can experience periods of hyperfocus on tasks she enjoy[s] (e.g., video games and YouTube shorts) to the point of time blindness, spending significantly more time than planned or beneficially on them, and being prone to losing track of events around Jerry Cisneros.    Trouble sustaining attention during conversations.   She noted this symptom occurs occasionally.     Does not follow through on instructions and fails to finish tasks.  She endorsed regularly having task initiation and maintenance issues.     Difficulty organizing tasks and activities.  She discussed commonly having clutter in Jerry Cisneros environment, although noted she prioritizes organization at Jerry Cisneros employment; haphazardly completing tasks; and trouble making and maintaining routines and plans.    Avoids, dislikes, or is reluctant to engage in tasks that require sustained mental effort. She noted that this symptom often occurs.     Loses things necessary for tasks or activities.  She reported commonly forgetting items needed for tasks.    Forgetful in daily activities.  She also reported a tendency to forget various daily activities (e.g., plans and returning phone calls or texts).     Hyperactivity and Impulsivity Criterion (DSM-5-TR)     Fidgets with hands or feet or squirms in seat. She described habitually, and largely unconsciously, fidgeting or squirming, adding that stay[ing] still requires a consistent conscious effort.     Leaves seat in situations in which remaining seated is expected.   She denied experiencing significant issues with this symptom.   Feelings of restlessness. She discussed regularly thinking of things she need[s] to be doing.    Difficulty playing or engaging in leisure activities quietly. She endorsed a tendency to hum or sing quietly while doing stuff or while playing video games, while others are around, sharing that it is self-stimulatory behavior.    On the go or often acts as if driven by a motor. She indicated a tendency to move quickly through tasks, which others have commented on.    Talks excessively.   She reported occasionally talking excessively.   Interrupts others.    She denied experiencing significant issues with this symptom.   Impatience.    She denied experiencing significant issues with this symptom.   ADHD-Related Symptoms that Assist in  Identifying ADHD but are not in the DSM-5-TR Jeanna, 2021, p. 6-12 and [phone])     Emotional dysregulation and overstimulation.  She expressed a belief that she can broadly regulate Jerry Cisneros emotions. However, she shared examples of being prone to upset and/or overwhelm when an obstacle  to a routine arises, when someone interrupts Jerry Cisneros while she is focused on a task, or when multiple people try to talk to Jerry Cisneros at once.   Making decisions impulsively. She indicated a tendency toward immediate gratification, which negatively impacts Jerry Cisneros ability to stick with and complete long-term goals, noting examples of making purchases or saying things she later regrets. She also noted trouble with planning contributes to impulsive spending.     Tending to drive much faster than others.  She discussed inattention to Jerry Cisneros speed and/or Jerry Cisneros running late can cause Jerry Cisneros to drive significantly over the speed limit, but that using cruise control has reduced the frequency of Jerry Cisneros speeding.    Trouble following through on promises or commitments made to others.  She described this symptom as recurring and noted that forgetfulness contributes to it.     Trouble doing things in proper order or sequence.   She denied experiencing significant issues with this symptom.   Jerry Cisneros discussed a history of autism spectrum disorder-related symptomatology, including difficulty starting and maintaining conversations with people She does not know well; trouble relat[ing] with others, being a pretty reserved person, and using an analytical approach to determine how someone is feeling and what their energy level is; generally hav[ing] a pretty flat affect; and sensory sensitivity (e.g., the pressure from a blood pressure cuff being perceived as extremely painful). She also discussed a history of depressive episodes that include depressed mood, reduced drive, and psychomotor slowing, that she indicated commonly occur secondary to  ADHD-related difficulties, academic problems, and/or the weather; social anxiety-related symptoms (e.g., regularly having concerns about how others are perceiving Jerry Cisneros, which can lead to avoiding social settings) that she noted she was working to address by increasingly spending time with like-minded others; hypervigilance- and anxiety-related symptomatology (e.g., feeling on edge and jumpy) while at home, which she attributed to unspecified behaviors from Jerry Cisneros father, having limited privacy, and Jerry Cisneros mother view[ing] certain things as satanic; sleep apnea and sleep maintenance problems, as well as regularly tossing and turning in Jerry Cisneros sleep; binge eating-related behaviors (e.g., eating an entire restaurant meal, and then eating another full meal and a milkshake), and that three times in the past she vomited to get rid of the calories; fleeting suicidal ideation (e.g., I should kill myself) without plan or intent, although she noted awareness of the importance of reaching out to trusted others and/or emergency services should she experience a mental health crisis as well as endorsed confidence in Jerry Cisneros ability to do so. Notably, Taksh shared that use of dietician services seemed to have helped in reducing aforementioned disordered eating behaviors. She endorsed intrusive and ruminative thoughts that she would rather not have, and that contribute to shame and guilt, depression-related symptoms and self-critical thoughts, and engaging in behaviors with a high potential for adverse consequences, adding that she finds herself acting on them impulsively without thinking and that she enjoys the attention she gets from others and the discomfort it can cause others. She expressed a belief that Jerry Cisneros ADHD-related symptoms have occurred since childhood, are largely consistent across situations, and are independent of mood. However, she noted that mood can exacerbate them. She stated Jerry Cisneros coping and  compensatory strategies include using GPS to track Jerry Cisneros speed, using cruise control to reduce speeding, using reminders, having a designated spot for items to reduce forgetfulness, and taking notes during lectures to help sustain Jerry Cisneros attention and reduce forgetfulness.   Tymar denied awareness of having ever experienced any developmental milestone delays,  grade retention, learning disability diagnosis, or having an individualized education plan. She discussed having natural academic strengths, which helped Jerry Cisneros overcome the adverse effects of having trouble completing homework and outside of class projects until high school. She reported that she graduated from high school with a 4.0 GPA, but that she procrastinated more and cheat[ed] on class assignments. She further reported that she tended to have significantly more difficulty with classes she was not interested in. She noted she was expelled from [employer] in middle school secondary to relational conflicts with another classmate. She denied having any other significant behavioral issues in primary and secondary schooling. She stated she dropped out of college, which she attributed to it being hard to keep up with the various class responsibilities, to adjust to living on [Jerry Cisneros] own, keep[ing] [herself] accountable, She tended to skip class, and because she procrastinated sending out college applications to an extent that by the time [she] did Jerry Cisneros only offer was for a school [she] didn't really want to go to. She shared that she is currently in community college, adding that it was hard to keep up with three accelerated courses at a time but that she can generally keep up with one [accelerated course]. She also shared that others' perceptions of Jerry Cisneros contribute to academic difficulties. She denied having a significant employment disciplinary history, noting that she is struggling to find employment she enjoys and is considering  becoming a lawyer. She endorsed being in romantic relationships with multiple people. She said that in the past she was too self-sacrificing and had difficulty setting boundaries, but that she has been working on it.  Oluwasemilore reported his medical history is significant for sleep apnea with use of CPAP and weight fluctuations secondary to binge eating, adding she previously utilized dietician services, but was prone to missing appointments as she did not receive appointment reminders. She stated that as a child, she had seizures for an unknown reason that seemingly ceased at the age of 25. She denied awareness of experiencing lingering effects from the seizures or having experienced a head injury. She reported previously attending PHP and IOP mental health services for stress at school that was very overwhelming, as well as that she is currently utilizing outpatient psychotherapeutic services that were initially started for depression, isolation, and not doing well in school, but that since this time she has been finding like-minded people, which has been helpful for Jerry Cisneros well-being. She also reported she is currently using Wellbutrin , but that it has been limited in its efficacy, adding that past use of Buspirone  and Hydroxyzine  was also not very helpful. She endorsed infrequent use of 12-20oz of soda but denied the use of all other recreational or illicit substances. She also denied ever meeting full criteria for hypomanic or manic episode; psychosis; trauma and stressor-related disorder; homicidal ideation, plan, or intent; or legal involvement. She stated Jerry Cisneros familial mental health is significant for having utilized psychotherapeutic services for an unknown reason (father), severe mental health issues and being in and out of psychiatric hospitalization (paternal great uncle), and possible ADHD and having a bad stutter (brother).   Chart Review: Per a 03/11/2021  telephone encounter note, Jerry Cisneros notified Dr. Dorothyann Byars that [Daley's] mom called and was requesting a referral to [facility] for ADHD evaluation for Jerry Cisneros.    Per a 05/18/2021 progress note, Jerry Cisneros, CMA, reported [Jerry Cisneros] reports that she spoke with w/Jerry Cisneros advisors at school and she was told that she should get tested  for ADHD.  Per a 05/18/2021 progress note, Dr. Dorothyann Byars noted, Jerry Cisneros presents for difficulties in school. She said since she was little before even starting school she remembers that she was always fidgety and moving all the time to the point that sometimes the teachers felt like it was disruptive. She is always made good grades in fact she is currently in college and has a full scholarship where she is out.  She reports that sometimes she has difficulty focusing but it is mostly when she gets home from school when she tries to get Jerry Cisneros work done she just feels like she is cannot quite focus and get on task and get things done. She feels like during class time she is actually able to feel focus on the teacher speaking.  No known family history of ADHD She says that she thinks maybe Jerry Cisneros uncle has it but she does not know if she has been formally tested. She sometimes does interrupt people when they are talking.  She often has trouble focusing on details and actually completing a project. She often has difficulty getting organized and remembering appointments and obligations.  In fact she had made an appointment to speak with a therapist and actually completely forgot about it and missed Jerry Cisneros appointment even though she had set a reminder. Dr. Byars also noted the ASRSv1.1 was administered, and that [Jerry Cisneros] had 5 positive questions in part a and 5 positive questions in part B. She reports that Jerry Cisneros symptoms started before school age. We will place referral for formal evaluation. The diagnosis was a little bit more challenging because she is  also having some concomitant depression and anxiety. But that is also in part due to not being as successful in school as she would like to be.   Per a 02/10/2022 appointment note, Nathanel Collet, [facility], reported that Jerry Cisneros expressed a belief She thinks she needs to be tested [for ADHD].  Per the 11/09/2023 appointment note, Nathanel Collet, [facility], administered the ASRS, and Jerry Cisneros scored 5/6 on Part A and 3/12 on Part B, indicating a positive screening for ADHD. Ms. 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  BEHAVIORAL OBSERVATIONS: Zakkary presented on time for the evaluation. She was well-groomed. She was oriented to the time, place, person, and purpose of the appointment. During the evaluation, Devereaux verbalized and/or demonstrated self-expression difficulties (e.g., abruptly changing Jerry Cisneros word choices or answer for a Verbal Comprehension Index subtest, regularly providing lengthy answers to Verbal Comprehension Index subtest items, and indicating she was having word finding difficulties), impulsivity (e.g., occasionally quickly stating digits before abruptly realizing she had made an error and then changing Jerry Cisneros answer and saying comments that were irrelevant to the answer She was providing), self-doubt (e.g., saying, I guess or I don't know after having provided a correct answer), and working memory-related difficulties (e.g., indicating she had abruptly loss the digits she was trying to retain or that she could no longer remember the digits she had just stated). She also demonstrated trouble inferring what information this evaluator's question was asking for (e.g., regularly asking this evaluator for clarification on what Jerry Cisneros question meant). Throughout the evaluation, he maintained appropriate eye contact. Jerry Cisneros thought processes and content were logical, coherent, and goal-directed. There were no overt signs of a thought disorder or perceptual  disturbances, nor did he report such symptomatology. There was no evidence of paraphasias (i.e., errors in speech, gross mispronunciations, and word substitutions), repetition deficits, or disturbances  in volume or prosody (i.e., rhythm and intonation). Overall, based on Cason's testing approach, the current results are believed to be a fair estimate of Jerry Cisneros abilities.  PROCEDURAL CONSIDERATIONS: Psychological testing was conducted via a virtual visit with video and audio capabilities, but otherwise in a standard manner.   The Wechsler Adult Intelligence Scale, Fifth Edition (WAIS-5) was administered via remote telepractice using digital stimulus materials on [employer]'s Q-global system. The remote testing environment appeared free of distractions, adequate rapport was established with the examinee via video and audio, and Lunde appeared appropriately engaged in the task throughout the session. No significant technological problems or distractions were noted during administration. Modifications to the standardization procedure included: none. The WAIS-5 subtests, or similar tasks, have received initial validation in several samples for remote telepractice and digital format administration, and the results are considered a valid description of Carr's skills and abilities.  CLINICAL FINDINGS:  COGNITIVE FUNCTIONING  Wechsler Adult Intelligence Scale, Fourth Edition (WAIS-5): [place] completed subtests of the WAIS-5, a full-scale measure of cognitive ability. The WAIS-5 comprises four indices that measure cognitive processes that comprise intellectual ability; however, only subtests from the Verbal Comprehension and Working Memory indices were administered. As a result, Full-Scale-IQ (FSIQ) and General Ability Index (GAI) could not be determined.   WAIS-5 Scale/Subtest Composite Score/Scaled Score 95% Confidence Interval Percentile Rank Qualitative Description  Verbal Comprehension (VCI) 140 130-144  99.6 Extremely High  Similarities 17     Vocabulary 17     Working Memory (WMI) 109 101-116 73 Average  Digit Sequencing  16     Running Digits 7     Digits Forward 11       The Verbal Comprehension Index (VCI) measures one's ability to receive, comprehend, and express language. It also measures the ability to retrieve previously learned information and to understand relationships between words and concepts presented orally. Leif obtained a VCI score of 140 (99.6th percentile), placing Jerry Cisneros in the extremely high range compared to peers of the same age. Jerry Cisneros performance on the subtests was similar, which suggests Jerry Cisneros verbal reasoning abilities are comparable. Upon follow-up, Darryon reported that she enjoyed the VCI subtests because they felt like thought exercises.  The Working Memory Index (WMI) measures one's ability to sustain attention, concentrate, and exert mental control. Abyan obtained a WMI score of 109 (73rd percentile), placing Jerry Cisneros in the average range compared to peers of similar age. Jerry Cisneros performance on the subtests was diverse. Jerry Cisneros best performance was on the Digit Sequencing Subtest, and Jerry Cisneros lowest performance was on the Running Digits subtest. This performance pattern suggests she best utilizes working memory when performing active mental manipulation of the information she registers and maintains, rather than when actively maintaining, refreshing, and updating it. It is also possible that a slower pace benefited Jerry Cisneros auditory registration of information.   ATTENTION AND PROCESSING  CNS Vital Signs: The CNS Vital Signs assessment evaluates the neurocognitive status of an individual and covers a range of mental processes. The results of the CNS Vital Signs testing indicated average neurocognitive processing ability. Attentional abilities ranged from average to above, with simple attention in the average range, and complex attention and sustained attention in the above range. Executive  function and cognitive flexibility were average. Working memory was above. Psychomotor speed and motor speed were average. Processing speed was above, which suggests quick thinking speed. Reaction time was low average, which indicates a slowed reaction to simple and increasingly complex direction sets. Visual memory (images) was average, and verbal  memory (words) was above average, indicating verbal memory is a relative strength. The results suggest Joshawn experiences weakness in reaction time and strengths in verbal memory, complex attention, processing speed, working memory, and sustained attention. Upon follow-up, Rui expressed the belief that she was occasionally guessing on the Visual Memory recall task and that she attempted to visualize Verbal Memory words as emoji, which seemed to assist Jerry Cisneros recall.   Domain  Standard Score Percentile Validity Indicator Guideline  Neurocognitive Index 106 66 Yes Average  Composite Memory 115 84 Yes Above  Verbal Memory 126 96 Yes Above  Visual Memory 102 55 Yes Average  Psychomotor Speed 108 70 Yes Average  Reaction Time 87 19 Yes Low Average  Complex Attention 116 86 Yes Above  Cognitive Flexibility 105 63 Yes Average  Processing Speed  113 81 Yes Above  Executive Function 104 61 Yes Average  Working Memory 119 90 Yes Above  Sustained Attention 115 84 Yes Above  Simple Attention 108 70 Yes Average  Motor Speed 97 42 Yes Average   EXECUTIVE FUNCTION Behavior Rating Inventory of Executive Function, Second Edition EVENT ORGANISER) Self-Report:  [place] completed the Self-Report Form of the Behavior Rating Inventory of Executive Function-Adult Version, Second Edition KIMBERLY-CLARK), which has three domains that evaluate cognitive, behavioral, and emotional regulation, and a Global Executive Composite score provides an overall snapshot of executive functioning. There are no missing item responses in the protocol. The Negativity, Infrequency, and Inconsistency  scales are not elevated, suggesting she did not respond to the protocol in an overly negative, haphazard, extreme, or inconsistent manner. In the context of these validity considerations, ratings of Arafat's everyday executive function suggest some areas of concern. The overall index score, the GEC, was moderately elevated (GEC T = 72, %ile = 97). The Emotion Regulation Index (ERI) score was within normal limits (ERI T = 45, %ile = 42), but the Behavior Regulation Index (BRI) score was moderately elevated (BRI T = 71, %ile = 98), and the Cognitive Regulation Index (CRI) score was highly elevated (CRI T = 79, %ile = >99). Azaryah indicated difficulty with Jerry Cisneros ability to resist impulses, be aware of Jerry Cisneros functioning in social settings, get going on tasks and activities and independently generate ideas, sustain working memory, plan and organize Jerry Cisneros approach to problem solving appropriately, and keep materials and belongings reasonably well-organized. She did not describe his ability to adjust well to changes, react to events appropriately, and be appropriately cautious in Jerry Cisneros approach to tasks and check for mistakes as problematic. However, the Task Monitor scale approached an abnormal elevation.   Scale/Index  Raw Score T Score Percentile Qualitative Description  Inhibit 18 72 99 Moderately Elevated  Self-Monitor 12 67 97 Mildly Elevated  Behavior Regulation Index (BRI) 30 71 98 Moderately Elevated  Shift 9 50 57 Within Normal Limits  Emotional Control 9 43 41 Within Normal Limits  Emotion Regulation Index (ERI) 18 45 42 Within Normal Limits  Initiate 22 78 >99 Highly Elevated  Working Memory 19 71 98 Moderately Elevated  Plan/Organize 22 79 >99 Highly Elevated  Task Monitor 13 64 95 Approaching an Abnormal Elevation  Organization of Materials 21 76 99 Highly Elevated  Cognitive Regulation Index (CRI) 97 79 >99 Highly Elevated  Global Executive Composite (GEC) 145 72 97 Moderately Elevated   Validity  Scale Raw Score Cumulative Percentile Protocol Classification  Inconsistency 4 98 Acceptable  Negativity 0 98 Acceptable  Infrequency 0 98 Acceptable   Behavior Rating Inventory of Executive Function, Second  Edition (BRIEF-2A) Informant:   Salar's brother, Kinley Ferrentino, completed the Informant Form of the Behavior Rating Inventory of Executive Function-Adult Version, Second Edition (BRIEF-2A), which is equivalent to the Self-Report version and has three domains that evaluate cognitive, behavioral, and emotional regulation, and a Global Executive Composite score provides an overall snapshot of executive functioning. There are no missing item responses in the protocol. The Negativity, Infrequency, and Inconsistency scales are not elevated, suggesting he did not respond to the protocol in an overly negative, haphazard, extreme, or inconsistent manner. In the context of these validity considerations, Paul's ratings of Keylor's everyday executive function raise some concerns. The overall index score, the GEC, was mildly elevated (GEC T = 69, %ile = 94). The Behavior Regulation Index (BRI) and Emotion Regulation Index (ERI) scores were within normal limits (BRI T = 51, %ile = 71; ERI T = 55, %ile = 74), but the Cognitive Regulation Index (CRI) score was highly elevated (CRI T = 76, %ile = 98). Deward indicated that Byard has trouble with Jerry Cisneros ability to get going on tasks and activities and independently generate ideas, plan and organize Jerry Cisneros approach to problem solving appropriately, and keep materials and belongings reasonably well-organized. Deward did not describe Jakell's ability to resist impulses, be aware of Jerry Cisneros functioning in social settings, adjust well to changes, react to events appropriately, sustain working memory, and be appropriately cautious in Jerry Cisneros approach to tasks and check for mistakes as problematic. The elevated scores on the Plan/Organize and Initiate scales suggest that Audiel has a  disorganized approach to problem-solving, leading to becoming overwhelmed by complex task demands. This may result in [place]ning shutting down and demonstrating poor task initiation. Individuals with similar profiles often do not know how to begin a task, which may also contribute to trouble keeping materials and belongings organized. Upon follow-up, Ronny shared that Jerry Cisneros actual executive functioning ability-related difficulty severity may fall somewhere between Jerry Cisneros and Paul's perception.   Scale/Index  Raw Score T Score Percentile Qualitative Description  Inhibit 13 58 84 Within Normal Lights  Self-Monitor 6 41 39 Within Normal Lights  Behavior Regulation Index (BRI) 19 51 71 Within Normal Lights  Shift 9 54 79 Within Normal Lights  Emotional Control 12 53 69 Within Normal Lights  Emotion Regulation Index (ERI) 21 55 74 Within Normal Lights  Initiate 23 87 >99 Highly Elevated  Working Memory 13 59 87 Within Normal Lights  Plan/Organize 17 69 96 Mildly Elevated  Task-Monitor 11 59 93 Within Normal Lights  Organization of Materials 22 75 99 Highly Elevated  Cognitive Regulation Index (CRI) 86 76 98 Highly Elevated  Global Executive Composite (GEC) 126 69 94 Mildly Elevated   Validity Scale Raw Score Cumulative Percentile Protocol Classification  Inconsistency 3 99 Acceptable  Negativity 1 98 Acceptable  Infrequency 0 98 Acceptable   BEHAVIORAL FUNCTIONING   Patient Health Questionnaire-9 (PHQ-9): Porfirio completed the PHQ-9, a self-report measure of depressive symptoms. She scored 17/27, which indicates moderately severe depression. She noted experiencing thoughts of suicide or self-harm on several days over the two weeks before completion of the measure. Upon follow-up, Kruze shared that Jerry Cisneros depression-related symptoms have become exacerbated in recent weeks secondary to starting a new employment and ongoing relational strains with Jerry Cisneros father.   Generalized Anxiety Disorder-7 (GAD-7): Yadiel  completed the GAD-7, a self-report measure that assesses symptoms of anxiety. She scored 8/21, which indicates mild anxiety.   Adult ADHD Self-Report Scale Symptom Checklist (ASRS): Jabir reported the following symptoms as sometimes:  difficulty wrapping up the final details of a project following the completion of challenging aspects, making careless mistakes when working on boring or complex projects, struggling to sustain attention when doing boring or repetitive work, being distracted by the noise around Jerry Cisneros, and talking too much in social situations. She did not endorse any symptoms as occurring often. She endorsed the following symptoms as very often: difficulty getting things in order when a task requires organization, problems remembering appointments or obligations, avoiding or delaying getting started on tasks requiring a lot of thought, fidgeting or squirming, and feeling restless or fidgety. The endorsement of at least four items in Part A is highly consistent with ADHD in adults. The frequency scores of Part B provide additional cues. Daquawn scored 5/6 on Part A and 1/12 on Part B, which is considered a positive screening for ADHD.   Social Responsiveness Scale-2 (SRS-2): The SRS-2 is a self-report measure that assesses the severity of social impairment commonly associated with Autism Spectrum Disorder (ASD). Jerry Cisneros denied experiencing significant social communication and interaction problems (SCI T = 51), impairment in identifying (AWR T = 44) and interpreting (COG T = 44) social cues, and engaging in expressive social communication (COM T = 48). However, she endorsed low motivation to engage in social-interpersonal behavior (MOT T = 66) and having restricted interests and engagement in repetitive behaviors (RRB T = 60). While she endorsed low motivation to engage in social-interpersonal behavior and having restricted interests and engagement in repetitive behaviors, Jerry Cisneros indication that she is not  experiencing social communication and interaction impairment is not suggestive of Jerry Cisneros meeting full criteria for Autism Spectrum Disorder. Upon follow-up, Jerry Cisneros shared a belief that the indication of low motivation to engage in social-interpersonal behavior is better explained by nervousness to do so.   Personality Assessment Inventory (PAI): The PAI is an objective inventory of adult personality. The validity indicators suggested Advik's profile is interpretable (ICN T = 52, INF T = 40, NIM T = 47, and PIM T = 41). She endorsed preoccupation with (SOM-H T = 64), and frequent occurrence of, various common physical symptoms and complaints of ill health (SOM-S T = 62); experiencing stress (ANX T = 65 and STR T = 75) and unhappiness at least part of the time (DEP T = 69) that includes worry and thoughts of inadequacy that results in impaired concentration (ANX-C T = 61), tension and difficulty relaxing (ANX-A T = 65), and overt physical signs of tension (e.g., trembling hands, sweaty palms, complaints of irregular heartbeat, and/or shortness of breath; ANX-P T = 64), anhedonia (DEP-A T = 69), and vegetative signs of depression (e.g., sleep disturbance, fatigue, and loss of appetite; DEP-P T = 62); an activity level that is somewhat higher than normal (MAN-A T = 63); and impulsivity and recklessness in areas with high potential for negative consequences (BOR-S T = 72 and ANT-S T = 64), as well as historical problems with authority and social convention (ANT-A T = 61). She indicated Jerry Cisneros social relationships offer Jerry Cisneros little support (NON T = 72), and that she may prefer to approach relationships in a passive manner, which may open Jerry Cisneros up to the possibility of mistreatment or exploitation by others (DOM T = 33). She acknowledged significant difficulties in functioning and perceives that help is needed to address them (RXR T = 25).    SUMMARY AND CLINICAL IMPRESSIONS: Koa Zoeller is a 23 year old who Nathanel Collet, [facility], referred for an evaluation to determine if he currently meets  criteria for a diagnosis of Attention-Deficit/Hyperactivity Disorder (ADHD).   Artice reported She completed an ADHD evaluation in 2021, noting Jerry Cisneros mother's involvement in the process at the time was unhelpful. She expressed a belief that Jerry Cisneros ADHD-related symptoms have occurred since childhood, are largely consistent across situations, and are independent of mood, but that mood can exacerbate them.   Cashis was administered assessments during the evaluation to measure his current cognitive abilities. His verbal comprehension abilities were in the extremely high range and similar. His ability to sustain attention, concentrate, and exert mental control was in the average range, which suggests a relative weakness in attention, concentration, mental control, and shorter-term auditory memory compared to Jerry Cisneros verbal reasoning abilities. Results of the CNS Vital Signs indicated an average neurocognitive processing ability with weakness in reaction time and strengths in verbal memory, complex attention, processing speed, working memory, and sustained attention.  During the clinical interview and on self-report measures, Myrick endorsed executive functioning concerns that include attentional dysregulation, hyperactivity- and impulsivity-related symptoms, and meeting the full criteria for ADHD. However, Jerry Cisneros brother, Jerry Cisneros, indicated that he perceives Jerry Cisneros as experiencing fewer executive functioning issues than Jerry Cisneros perceives herself as experiencing. It is unclear what caused this discrepancy. However, Jerry Cisneros shared a belief that Jerry Cisneros actual executive functioning ability-related difficulty severity may be somewhere between Jerry Cisneros and Jerry Cisneros's perceptions. 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 The specifier of Mild was assigned as Jerry Cisneros did not endorse symptoms in significant excess of what is needed to make the diagnosis, and Jerry Cisneros brother endorsed a perception that she is not experiencing multiple significant executive functioning issues. However, she noted Jerry Cisneros ADHD-related symptoms cause impairment or considerable difficulty in academic (e.g., trouble keep[ing] up with the various class responsibilities and having a propensity to skip class), occupational (e.g., struggling to find employment she enjoys), social (e.g., occasionally engaging in excessive), and daily (e.g., regularly experiencing being easily distracted, task initiation and completion issues, disorganization, and forgetfulness) functioning.    Jerry Cisneros discussed a history of autism spectrum disorder-related symptomatology; depressive episodes that she indicated commonly occur secondary to ADHD-related difficulties, academic problems, and/or the weather; social anxiety-related symptoms; hypervigilance- and anxiety-related symptomatology while at home, which she attributed to unspecified behaviors from Jerry Cisneros father, having limited privacy, and Jerry Cisneros mother view[ing] certain things as satanic; sleep apnea and sleep maintenance problems, as well as regularly tossing and turning in Jerry Cisneros sleep; binge eating-related behaviors, and that three times in the past she vomited to get rid of the calories; and fleeting suicidal ideation. As such, the PHQ-9, GAD-7, SRS-2, and PAI were administered. The results suggest she experiences moderately severe depression- and mild anxiety-related symptomatology, and that she is denying meeting full criteria for Autism Spectrum Disorder. However,  information provided during the interview was indicative of Jerry Cisneros endorsing many of the components of autism spectrum disorder. Given the limited scope of this evaluation, it was not possible to determine if the full criteria for autism spectrum disorder, depressive disorder, anxiety disorder(s), trauma and stressor-related disorder, sleep-wake disorder, and eating disorder are met, or if Jerry Cisneros diagnosis of ADHD better explains the symptoms. Thus, she would likely benefit from further evaluation of these symptoms to definitively rule in or out the aforementioned disorders.  Should any of the aforementioned be ruled in, they would likely be in addition to Jerry Cisneros diagnosis of ADHD, as she described Jerry Cisneros ADHD-related concerns as occurring before some of the concerns and endorsed trauma-related symptoms, consistent across situations, and independent of mood. Moreover, she endorsed ADHD-related symptoms less commonly associated with Autism Spectrum Disorder (e.g., often being distracted by various external and internal stimuli [versus primarily internal], and impulsivity and gravitating toward short-term versus long-term goals even when long-term goals are better).  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   Individual therapeutic services may assist in processing a diagnosis of ADHD and discussing coping and compensatory strategies. Jerry Cisneros would likely benefit from making use of strategies for ADHD symptoms:  Setting a timer to complete tasks. Break tasks into manageable chunks and spread them out over more extended periods with breaks.  Utilizing lists and day calendars to keep track of tasks.  Answering emails daily.  Improve listening skills by asking the speaker to give information in smaller chunks and asking for  explanations and clarification as needed. Leaving more than the anticipated time to complete tasks. It may help to keep tasks brief, well within your attention span, and a mix of both high and low-interest tasks. Tasks may be gradually increased in length. Practice proactive planning by setting aside time every evening to plan for the next day (e.g., prepare needed materials or pack the car the night before).  Learn how to make a practical and reasonable to-do list of important tasks and priorities, and always keep it easily accessible. Make additional copies in case it is lost or misplaced. Utilize visual reminders by posting appointments, to-do lists, or schedules in strategic areas at home and work.  Practice using an appointment book, smartphone, or other tech device, or a daily planning calendar, and learn to write down appointments and commitments immediately. Keep notepads or use a portable audio recorder to capture important ideas that would be beneficial to recall later. Learn and practice time management skills. Purchase a programmable alarm watch or set an alarm on a smartphone to avoid losing track of time.  Use a color-coded file system, desk and closet organizers, storage boxes, or other organization devices to reduce clutter and improve efficiency and structure.  Implement ways to become more aware of your actions and to inhibit or adjust them as warranted (e.g., reviewing videos of your actions, considering consequences of obeying or not obeying the rules of various upcoming situations, having a trusted other to discuss plans with, and/or provide cues to stop certain behaviors, and make visual cues for rules you would like to follow). The 4Rs: Read just one paragraph, recite out loud in a soft voice or whisper what was important in that material, write that material down in a notebook, then review what you just wrote. Stay flexible and be prepared to change your plans, as symptom  breakthroughs and crises will likely occur periodically.  Jerry Cisneros may benefit from mindfulness training to address symptoms of inattention.  Mental alertness/energy can be raised by increasing exercise; improving sleep; eating a healthy diet; and managing stress. Consulting with a physician regarding any changes to the physical regimen is recommended. Failing at Normal: An ADHD Success Story by Harlene Solar is a great overview of ADHD. Dr. Nelwyn Cisneros also has a YouTube channel called Nelwyn Pica, PhD - Dedicated to ADHD Science+ with helpful  videos on ADHD-related topics: [email or web address]  Applications:  RescueTime. Tracks your activities on your phone and/or computer to determine how productive you have been and what distracted you. Free two-week trial.  [email or web address] . It uses engineered audio that may reduce distractions and assist with focus. Free 15-day trial. Freedom. Allows you to highlight days and times you want to block yourself from certain sites or apps. Free trial. Merrily.  It allows you to input your bank accounts and creates a visual layout of information about your financial goals, budget management, alerts, etc. May offer a free trial. Boomerang. It allows you to schedule when an email is sent and to see whether others have received or opened it. Ten messages free per month and a free trial of the premium version. IFTTT. Uses channels to create various actions (e.g., if you are mentioned in an email, highlight it in your inbox, and if you miss a call, add it to a to-do list). Free and premium versions. Unroll.me. Cleans up your email by unsubscribing from what you do not want to receive while still getting everything you do. Free. Finish. It allows you to divide two-list tasks into short-, mid-, and long-term tasks and determine how much time remains for each. Focus mode hides non-priority tasks.  Autosilent. Turns your phone ringer on and off  based on specified calendars, geo-fences, timers, etc. $3.99. Freakyalarm. It makes you solve math problems to disable an alarm. $1.99. Wake N Shake. It makes you vigorously shake your phone to stop the alarm. $.99. Todoist. It allows you to add sub-tasks to tasks and includes email and web plugins to make it work across the system. Premium has location-based reminders, calendar sync, productive tracking, etc.  Sleep Cycle. Utilize your phone's motion sensors to catch movement while you are asleep. The alarm will wake you up as early as 30 minutes before your scheduled wake-up time, based on your lightest sleep phase, and show you how daily activities affect your sleep quality.  Colletta. Gamifies mental wellness by letting you choose from a wide variety of self-care exercises to complete, which it rewards with the ability to care for a virtual pet. It also includes features such as mood tracking, breathing exercises, guided journaling, and setting mutual goals with friends. Books: Taking Charge of Adult ADHD Second Edition by Dr. Nelwyn Cisneros The ADHD Effect on Marriage by Eleanor Bowers The Couples Guide to Thriving with ADHD by Eleanor Bowers Stamp Guide for College Students with ADHD and LD by Dr. Rollo Fess Organizations that are a reliable source of information on ADHD:  Children and Adults with Attention-Deficit/Hyperactivity Disorder (CHADD): chadd.org  Attention Deficit Disorder Association (ADDA): hotternames.de ADD Resources: addresources.org ADD WareHouse: addwarehouse.com World Federation of ADHD: adhd-federation.org ADDConsults: addconsults.com Compilation of ADHD resources: https://www.harrell.com/ Future evaluation, if deemed necessary, and/or to determine the effectiveness of recommended interventions.   Frederic Fire, Psy.D. Licensed Psychologist - HSP-P [phone]   References  Cisneros, R. A. (2021). Taking charge of adult ADHD: proven  strategies to succeed at work, at   home, and in relationships (pp. 6-10 and 272-276). [employer].           Frederic ONEIDA Fire, PsyD

## 2024-03-21 ENCOUNTER — Ambulatory Visit: Admitting: Professional

## 2024-03-26 ENCOUNTER — Other Ambulatory Visit (HOSPITAL_COMMUNITY): Payer: Self-pay | Admitting: Psychiatry

## 2024-04-04 ENCOUNTER — Ambulatory Visit (HOSPITAL_COMMUNITY): Admitting: Psychiatry

## 2024-04-04 ENCOUNTER — Encounter (HOSPITAL_COMMUNITY): Payer: Self-pay

## 2024-04-04 ENCOUNTER — Ambulatory Visit: Admitting: Professional

## 2024-04-16 ENCOUNTER — Ambulatory Visit (HOSPITAL_COMMUNITY): Admitting: Psychiatry

## 2024-04-18 ENCOUNTER — Ambulatory Visit: Admitting: Professional

## 2024-05-02 ENCOUNTER — Ambulatory Visit: Admitting: Professional

## 2024-05-16 ENCOUNTER — Ambulatory Visit: Admitting: Professional

## 2024-05-30 ENCOUNTER — Ambulatory Visit: Admitting: Professional
